# Patient Record
Sex: Female | Born: 1965 | Race: White | Hispanic: No | Smoking: Current every day smoker
Health system: Southern US, Community
[De-identification: ages and names within clinical notes are randomized; demographics above are authoritative.]

## PROBLEM LIST (undated history)

## (undated) DIAGNOSIS — H9209 Otalgia, unspecified ear: Secondary | ICD-10-CM

## (undated) DIAGNOSIS — F32A Depression, unspecified: Secondary | ICD-10-CM

## (undated) DIAGNOSIS — J302 Other seasonal allergic rhinitis: Secondary | ICD-10-CM

## (undated) DIAGNOSIS — F988 Other specified behavioral and emotional disorders with onset usually occurring in childhood and adolescence: Secondary | ICD-10-CM

## (undated) DIAGNOSIS — T7840XA Allergy, unspecified, initial encounter: Secondary | ICD-10-CM

## (undated) DIAGNOSIS — R87619 Unspecified abnormal cytological findings in specimens from cervix uteri: Secondary | ICD-10-CM

## (undated) DIAGNOSIS — K219 Gastro-esophageal reflux disease without esophagitis: Secondary | ICD-10-CM

## (undated) DIAGNOSIS — F329 Major depressive disorder, single episode, unspecified: Secondary | ICD-10-CM

## (undated) DIAGNOSIS — J449 Chronic obstructive pulmonary disease, unspecified: Secondary | ICD-10-CM

## (undated) DIAGNOSIS — R55 Syncope and collapse: Secondary | ICD-10-CM

## (undated) DIAGNOSIS — R51 Headache: Secondary | ICD-10-CM

## (undated) DIAGNOSIS — B002 Herpesviral gingivostomatitis and pharyngotonsillitis: Secondary | ICD-10-CM

## (undated) DIAGNOSIS — Z9289 Personal history of other medical treatment: Secondary | ICD-10-CM

## (undated) HISTORY — DX: Gastro-esophageal reflux disease without esophagitis: K21.9

## (undated) HISTORY — DX: Personal history of other medical treatment: Z92.89

## (undated) HISTORY — DX: Herpesviral gingivostomatitis and pharyngotonsillitis: B00.2

## (undated) HISTORY — DX: Syncope and collapse: R55

## (undated) HISTORY — PX: BREAST BIOPSY: SHX20

## (undated) HISTORY — DX: Allergy, unspecified, initial encounter: T78.40XA

## (undated) HISTORY — DX: Chronic obstructive pulmonary disease, unspecified: J44.9

## (undated) HISTORY — DX: Depression, unspecified: F32.A

## (undated) HISTORY — DX: Otalgia, unspecified ear: H92.09

## (undated) HISTORY — DX: Headache: R51

## (undated) HISTORY — DX: Major depressive disorder, single episode, unspecified: F32.9

## (undated) HISTORY — PX: TONSILLECTOMY AND ADENOIDECTOMY: SUR1326

## (undated) HISTORY — DX: Unspecified abnormal cytological findings in specimens from cervix uteri: R87.619

## (undated) HISTORY — DX: Other specified behavioral and emotional disorders with onset usually occurring in childhood and adolescence: F98.8

## (undated) HISTORY — DX: Other seasonal allergic rhinitis: J30.2

---

## 1993-09-10 DIAGNOSIS — R87619 Unspecified abnormal cytological findings in specimens from cervix uteri: Secondary | ICD-10-CM

## 1993-09-10 HISTORY — DX: Unspecified abnormal cytological findings in specimens from cervix uteri: R87.619

## 2006-02-10 ENCOUNTER — Ambulatory Visit: Payer: Self-pay | Admitting: Internal Medicine

## 2006-04-10 ENCOUNTER — Ambulatory Visit: Payer: Self-pay

## 2007-05-22 ENCOUNTER — Ambulatory Visit: Payer: Self-pay | Admitting: General Surgery

## 2012-10-26 DIAGNOSIS — R519 Headache, unspecified: Secondary | ICD-10-CM

## 2012-10-26 DIAGNOSIS — R55 Syncope and collapse: Secondary | ICD-10-CM

## 2012-10-26 HISTORY — DX: Headache, unspecified: R51.9

## 2012-10-26 HISTORY — DX: Syncope and collapse: R55

## 2013-12-12 DIAGNOSIS — B002 Herpesviral gingivostomatitis and pharyngotonsillitis: Secondary | ICD-10-CM

## 2013-12-12 HISTORY — DX: Herpesviral gingivostomatitis and pharyngotonsillitis: B00.2

## 2016-03-17 ENCOUNTER — Other Ambulatory Visit: Payer: Self-pay | Admitting: Obstetrics and Gynecology

## 2016-03-17 DIAGNOSIS — Z1231 Encounter for screening mammogram for malignant neoplasm of breast: Secondary | ICD-10-CM

## 2016-03-17 LAB — HM PAP SMEAR

## 2016-04-01 ENCOUNTER — Ambulatory Visit
Admission: RE | Admit: 2016-04-01 | Discharge: 2016-04-01 | Disposition: A | Discharge status: Home / Self Care | Payer: Self-pay | Source: Ambulatory Visit | Attending: Obstetrics and Gynecology | Admitting: Obstetrics and Gynecology

## 2016-04-01 ENCOUNTER — Other Ambulatory Visit: Payer: Self-pay | Admitting: Obstetrics and Gynecology

## 2016-04-01 DIAGNOSIS — Z1231 Encounter for screening mammogram for malignant neoplasm of breast: Secondary | ICD-10-CM | POA: Insufficient documentation

## 2016-04-01 LAB — HM MAMMOGRAPHY

## 2016-12-16 ENCOUNTER — Other Ambulatory Visit: Payer: Self-pay | Admitting: Obstetrics and Gynecology

## 2016-12-16 NOTE — Telephone Encounter (Signed)
Pt given 3 months of Rx 2/18.

## 2017-02-14 ENCOUNTER — Other Ambulatory Visit: Payer: Self-pay | Admitting: Obstetrics and Gynecology

## 2017-02-14 ENCOUNTER — Telehealth: Payer: Self-pay

## 2017-02-14 MED ORDER — AMPHETAMINE-DEXTROAMPHETAMINE 10 MG PO TABS: 10.0000 mg | ORAL_TABLET | Freq: Two times a day (BID) | ORAL | 0 refills | Status: DC

## 2017-02-14 NOTE — Telephone Encounter (Signed)
Pt aware and plans to schedule annual exam when she picks up rx.

## 2017-02-14 NOTE — Telephone Encounter (Signed)
RN to notify pt to pick up Rx at front desk. Annual due 6/18

## 2017-02-14 NOTE — Telephone Encounter (Signed)
Patient calling to request a refill on her adderall rx. Please advise. Cb# [phone]. Thank you!

## 2017-03-16 ENCOUNTER — Other Ambulatory Visit: Payer: Self-pay | Admitting: Obstetrics and Gynecology

## 2017-03-16 ENCOUNTER — Telehealth: Payer: Self-pay

## 2017-03-16 DIAGNOSIS — F988 Other specified behavioral and emotional disorders with onset usually occurring in childhood and adolescence: Secondary | ICD-10-CM

## 2017-03-16 MED ORDER — AMPHETAMINE-DEXTROAMPHETAMINE 10 MG PO TABS: 10.0000 mg | ORAL_TABLET | Freq: Two times a day (BID) | ORAL | 0 refills | Status: DC

## 2017-03-16 NOTE — Telephone Encounter (Signed)
Pt aware Rx is ready for pickup 

## 2017-03-16 NOTE — Telephone Encounter (Signed)
Pt called triage line stating she needs a refill on her Adderall. CB# [phone]

## 2017-03-16 NOTE — Telephone Encounter (Signed)
RN to notify pt to pick up at front desk. Thx.

## 2017-04-17 ENCOUNTER — Telehealth: Payer: Self-pay

## 2017-04-17 NOTE — Telephone Encounter (Signed)
Pt requests refill on Adderall.

## 2017-04-18 ENCOUNTER — Other Ambulatory Visit: Payer: Self-pay | Admitting: Obstetrics and Gynecology

## 2017-04-18 MED ORDER — AMPHETAMINE-DEXTROAMPHETAMINE 10 MG PO TABS: 10.0000 mg | ORAL_TABLET | Freq: Two times a day (BID) | ORAL | 0 refills | Status: DC

## 2017-04-18 NOTE — Telephone Encounter (Signed)
Pt aware.

## 2017-04-18 NOTE — Telephone Encounter (Signed)
RN to notify pt ready to pick up at front desk.

## 2017-05-10 ENCOUNTER — Ambulatory Visit: Payer: Self-pay | Admitting: Obstetrics and Gynecology

## 2017-05-17 ENCOUNTER — Other Ambulatory Visit: Payer: Self-pay

## 2017-05-17 NOTE — Telephone Encounter (Signed)
RN to notify pt she is past due for annual. Pls sched and I'll RF adderall till then. Thx.

## 2017-05-17 NOTE — Telephone Encounter (Signed)
Pt calling for refill of adderall.  [phone]

## 2017-05-18 ENCOUNTER — Other Ambulatory Visit: Payer: Self-pay | Admitting: Obstetrics and Gynecology

## 2017-05-18 MED ORDER — AMPHETAMINE-DEXTROAMPHETAMINE 10 MG PO TABS: 10.0000 mg | ORAL_TABLET | Freq: Two times a day (BID) | ORAL | 0 refills | Status: DC

## 2017-05-18 NOTE — Telephone Encounter (Signed)
Patient has rescheduled her annual for second week of October.  Patient apologized for having to rescheduling.  She had to go out of town for an emergency.

## 2017-05-18 NOTE — Telephone Encounter (Signed)
Please advise 

## 2017-05-18 NOTE — Telephone Encounter (Signed)
RN to notify pt Rx at front desk to pick up.

## 2017-05-18 NOTE — Telephone Encounter (Signed)
Pt aware of medication prescription being ready for pick up

## 2017-05-18 NOTE — Progress Notes (Signed)
Rx RF adderall. Pt has annual 10/18.

## 2017-07-06 ENCOUNTER — Encounter: Payer: Self-pay | Admitting: Obstetrics and Gynecology

## 2017-07-06 ENCOUNTER — Ambulatory Visit (INDEPENDENT_AMBULATORY_CARE_PROVIDER_SITE_OTHER): Payer: Self-pay | Admitting: Obstetrics and Gynecology

## 2017-07-06 VITALS — BP 118/76 | HR 80 | Ht 60.0 in | Wt 116.0 lb

## 2017-07-06 DIAGNOSIS — F419 Anxiety disorder, unspecified: Secondary | ICD-10-CM

## 2017-07-06 DIAGNOSIS — Z1211 Encounter for screening for malignant neoplasm of colon: Secondary | ICD-10-CM

## 2017-07-06 DIAGNOSIS — Z01419 Encounter for gynecological examination (general) (routine) without abnormal findings: Secondary | ICD-10-CM

## 2017-07-06 DIAGNOSIS — Z1231 Encounter for screening mammogram for malignant neoplasm of breast: Secondary | ICD-10-CM

## 2017-07-06 DIAGNOSIS — Z1239 Encounter for other screening for malignant neoplasm of breast: Secondary | ICD-10-CM

## 2017-07-06 DIAGNOSIS — F988 Other specified behavioral and emotional disorders with onset usually occurring in childhood and adolescence: Secondary | ICD-10-CM

## 2017-07-06 DIAGNOSIS — B001 Herpesviral vesicular dermatitis: Secondary | ICD-10-CM

## 2017-07-06 LAB — HEMOCCULT GUIAC POC 1CARD (OFFICE): FECAL OCCULT BLD: NEGATIVE

## 2017-07-06 MED ORDER — FLUOXETINE HCL 10 MG PO TABS: 10.0000 mg | ORAL_TABLET | Freq: Every day | ORAL | 5 refills | Status: DC

## 2017-07-06 MED ORDER — VALACYCLOVIR HCL 1 G PO TABS: ORAL_TABLET | ORAL | 1 refills | Status: DC

## 2017-07-06 MED ORDER — AMPHETAMINE-DEXTROAMPHETAMINE 10 MG PO TABS: 10.0000 mg | ORAL_TABLET | Freq: Two times a day (BID) | ORAL | 0 refills | Status: DC

## 2017-07-06 NOTE — Progress Notes (Signed)
PCP: Patient, No Pcp Per   Chief Complaint  Patient presents with  . Gynecologic Exam    HPI:      [name] is a 51 y.o. [id number] who LMP was No LMP recorded., presents today for her annual examination.  Her menses are absent since 12/17. She does not have intermenstrual bleeding.  She does have vasomotor sx, night sweats worse than hot flashes.   Sex activity: single partner, contraception - post menopausal status. She does have vaginal dryness, uses lubricants.  Last Pap: March 17, 2016  Results were: no abnormalities /neg HPV DNA.  Hx of STDs: none  Last mammogram: April 01, 2016  Results were: normal--routine follow-up in 12 months There is no FH of breast cancer. There is no FH of ovarian cancer. The patient does do self-breast exams.  Colonoscopy: not recent; will do it 1/19 once gets insurance  Tobacco use: The patient currently smokes 1/4 packs of cigarettes per day for the past many years. She is planning to quit.  Alcohol use: none Exercise: moderately active  She does get adequate calcium and Vitamin D in her diet.  She takes prozac occas for anxiety sx with sx relief. She needs Rx RF.  She also needs RF on valtrex for cold sores.  She takes adderall BID for concentration with improvement most days.  Past Medical History:  Diagnosis Date  . Abnormal Pap smear of cervix 09/10/1993   LGSIL  . ADD (attention deficit disorder)   . Depression   . Ear pain   . GERD (gastroesophageal reflux disease)   . Head ache 10/26/2012  . History of mammogram 02/14/2015; 04/01/2016   BIRAD 1; NEG  . History of Papanicolaou smear of cervix 10/26/2012; 03/17/2016   -/-; -/-  . 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 Seasonal allergies   . Syncope and collapse 10/26/2012    Past Surgical History:  Procedure Laterality Date  . BREAST BIOPSY Left    CORE - NEG  . TONSILLECTOMY AND ADENOIDECTOMY      Family History  Problem Relation Age of Onset  . Diabetes Mother        TYPE 2    . Breast cancer Neg Hx     Social History   Social History  . Marital status: Divorced    Spouse name: N/A  . Number of children: 2  . Years of education: N/A   Occupational History  . Not on file.   Social History Main Topics  . Smoking status: Current Some Day Smoker    Packs/day: 0.25    Types: Cigarettes  . Smokeless tobacco: Never Used  . Alcohol use Yes  . Drug use: No  . Sexual activity: Yes    Birth control/ protection: None, Post-menopausal   Other Topics Concern  . Not on file   Social History Narrative  . No narrative on file    Current Meds  Medication Sig  . amphetamine-dextroamphetamine (ADDERALL) 10 MG tablet Take 1 tablet (10 mg total) by mouth 2 (two) times daily with a meal. Can fill after 09/17/17  . cetirizine (ZYRTEC) 10 MG tablet Take 10 mg by mouth daily.  Marland Kitchen FLUoxetine (PROZAC) 10 MG tablet Take 1 tablet (10 mg total) by mouth daily.  . [DISCONTINUED] amphetamine-dextroamphetamine (ADDERALL) 10 MG tablet Take 1 tablet (10 mg total) by mouth 2 (two) times daily with a meal. Can fill after 06/18/17  . [DISCONTINUED] amphetamine-dextroamphetamine (ADDERALL) 10 MG tablet Take 1 tablet (10 mg  total) by mouth 2 (two) times daily with a meal. Can fill after 07/18/17  . [DISCONTINUED] amphetamine-dextroamphetamine (ADDERALL) 10 MG tablet Take 1 tablet (10 mg total) by mouth 2 (two) times daily with a meal. Can fill after 08/18/17  . [DISCONTINUED] FLUoxetine (PROZAC) 10 MG tablet Take 1 tablet by mouth daily.      ROS:  Review of Systems  Constitutional: Negative for fatigue, fever and unexpected weight change.  Respiratory: Negative for cough, shortness of breath and wheezing.   Cardiovascular: Negative for chest pain, palpitations and leg swelling.  Gastrointestinal: Negative for blood in stool, constipation, diarrhea, nausea and vomiting.  Endocrine: Negative for cold intolerance, heat intolerance and polyuria.  Genitourinary: Negative for  dyspareunia, dysuria, flank pain, frequency, genital sores, hematuria, menstrual problem, pelvic pain, urgency, vaginal bleeding, vaginal discharge and vaginal pain.  Musculoskeletal: Negative for back pain, joint swelling and myalgias.  Skin: Negative for rash.  Neurological: Negative for dizziness, syncope, light-headedness, numbness and headaches.  Hematological: Negative for adenopathy.  Psychiatric/Behavioral: Negative for agitation, confusion, sleep disturbance and suicidal ideas. The patient is not nervous/anxious.      Objective: BP 118/76   Pulse 80   Ht 5' (1.524 m)   Wt 116 lb (52.6 kg)   BMI 22.65 kg/m    Physical Exam  Constitutional: She is oriented to person, place, and time. She appears well-developed and well-nourished.  Genitourinary: Rectum normal, vagina normal and uterus normal. There is no rash or tenderness on the right labia. There is no rash or tenderness on the left labia. No erythema or tenderness in the vagina. No vaginal discharge found. Right adnexum does not display mass and does not display tenderness. Left adnexum does not display mass and does not display tenderness. Cervix does not exhibit motion tenderness or polyp. Uterus is not enlarged or tender. Rectal exam shows no fissure, no tenderness and guaiac negative stool.  Neck: Normal range of motion. No thyromegaly present.  Cardiovascular: Normal rate, regular rhythm and normal heart sounds.   No murmur heard. Pulmonary/Chest: Effort normal and breath sounds normal. Right breast exhibits no mass, no nipple discharge, no skin change and no tenderness. Left breast exhibits no mass, no nipple discharge, no skin change and no tenderness.  Abdominal: Soft. There is no tenderness. There is no guarding.  Musculoskeletal: Normal range of motion.  Neurological: She is alert and oriented to person, place, and time. No cranial nerve deficit.  Psychiatric: She has a normal mood and affect. Her behavior is normal.    Vitals reviewed.   Results: Results for orders placed or performed in visit on 07/06/17 (from the past 24 hour(s))  POCT Occult Blood Stool     Status: Normal   Collection Time: 07/06/17  9:47 AM  Result Value Ref Range   Fecal Occult Blood, POC Negative Negative   Card #1 Date     Card #2 Fecal Occult Blod, POC     Card #2 Date     Card #3 Fecal Occult Blood, POC     Card #3 Date      Assessment/Plan:  Encounter for annual routine gynecological examination  Screening for breast cancer - Pt to sched mammo. May wait till ins 1/19. - Plan: MM DIGITAL SCREENING BILATERAL  Attention deficit disorder (ADD) without hyperactivity - Rx RF adderall for 3 months given to pt. - Plan: amphetamine-dextroamphetamine (ADDERALL) 10 MG tablet, DISCONTINUED: amphetamine-dextroamphetamine (ADDERALL) 10 MG tablet  Screening for colon cancer - Neg FOBT. Pt will call  for GI ref for scr colonoscopy 1/19 once has ins. - Plan: POCT Occult Blood Stool  Cold sore - Rx RF valtrex. - Plan: valACYclovir (VALTREX) 1000 MG tablet  Anxiety - Rx RF prozac. Pt takes episodically wtih sx relief.  - Plan: FLUoxetine (PROZAC) 10 MG tablet   Meds ordered this encounter  Medications  . cetirizine (ZYRTEC) 10 MG tablet    Sig: Take 10 mg by mouth daily.  Marland Kitchen DISCONTD: amphetamine-dextroamphetamine (ADDERALL) 10 MG tablet    Sig: Take 1 tablet (10 mg total) by mouth 2 (two) times daily with a meal. Can fill after 07/18/17    Dispense:  60 tablet    Refill:  0  . DISCONTD: amphetamine-dextroamphetamine (ADDERALL) 10 MG tablet    Sig: Take 1 tablet (10 mg total) by mouth 2 (two) times daily with a meal. Can fill after 08/18/17    Dispense:  60 tablet    Refill:  0  . amphetamine-dextroamphetamine (ADDERALL) 10 MG tablet    Sig: Take 1 tablet (10 mg total) by mouth 2 (two) times daily with a meal. Can fill after 09/17/17    Dispense:  60 tablet    Refill:  0  . FLUoxetine (PROZAC) 10 MG tablet    Sig: Take 1  tablet (10 mg total) by mouth daily.    Dispense:  30 tablet    Refill:  5  . valACYclovir (VALTREX) 1000 MG tablet    Sig: Take 2 tabs BID x 1 day prn sx    Dispense:  30 tablet    Refill:  1           GYN counsel mammography screening, menopause, adequate intake of calcium and vitamin D, diet and exercise    F/U  Return in about 1 year (around 07/06/2018).  Cashius Grandstaff B. Franciscojavier Wronski, PA-C 07/06/2017 9:48 AM

## 2017-10-17 ENCOUNTER — Other Ambulatory Visit: Payer: Self-pay | Admitting: Obstetrics and Gynecology

## 2017-10-17 DIAGNOSIS — F988 Other specified behavioral and emotional disorders with onset usually occurring in childhood and adolescence: Secondary | ICD-10-CM

## 2017-10-18 ENCOUNTER — Other Ambulatory Visit: Payer: Self-pay | Admitting: Obstetrics and Gynecology

## 2017-10-18 DIAGNOSIS — F988 Other specified behavioral and emotional disorders with onset usually occurring in childhood and adolescence: Secondary | ICD-10-CM

## 2017-10-18 MED ORDER — AMPHETAMINE-DEXTROAMPHETAMINE 10 MG PO TABS: 10.0000 mg | ORAL_TABLET | Freq: Two times a day (BID) | ORAL | 0 refills | Status: DC

## 2017-10-18 NOTE — Progress Notes (Unsigned)
3 mo Rx RF adderal to pick up.

## 2017-10-18 NOTE — Progress Notes (Signed)
Pt aware.

## 2017-10-18 NOTE — Telephone Encounter (Signed)
Please advise 

## 2018-01-16 ENCOUNTER — Other Ambulatory Visit: Payer: Self-pay | Admitting: Obstetrics and Gynecology

## 2018-01-16 DIAGNOSIS — F988 Other specified behavioral and emotional disorders with onset usually occurring in childhood and adolescence: Secondary | ICD-10-CM

## 2018-01-16 MED ORDER — AMPHETAMINE-DEXTROAMPHETAMINE 10 MG PO TABS: 10.0000 mg | ORAL_TABLET | Freq: Two times a day (BID) | ORAL | 0 refills | Status: DC

## 2018-01-16 NOTE — Progress Notes (Signed)
Rx RF adderall for 3 months left to pick up at front desk.

## 2018-01-16 NOTE — Telephone Encounter (Signed)
Please advise. Thank you

## 2018-04-16 ENCOUNTER — Other Ambulatory Visit: Payer: Self-pay | Admitting: Obstetrics and Gynecology

## 2018-04-16 DIAGNOSIS — F988 Other specified behavioral and emotional disorders with onset usually occurring in childhood and adolescence: Secondary | ICD-10-CM

## 2018-04-17 ENCOUNTER — Other Ambulatory Visit: Payer: Self-pay | Admitting: Obstetrics and Gynecology

## 2018-04-17 DIAGNOSIS — F988 Other specified behavioral and emotional disorders with onset usually occurring in childhood and adolescence: Secondary | ICD-10-CM

## 2018-04-17 MED ORDER — AMPHETAMINE-DEXTROAMPHETAMINE 10 MG PO TABS: 10.0000 mg | ORAL_TABLET | Freq: Two times a day (BID) | ORAL | 0 refills | Status: DC

## 2018-04-17 NOTE — Telephone Encounter (Signed)
Last appointment for annual was 07/06/17. No future appointments scheduled.

## 2018-04-17 NOTE — Progress Notes (Signed)
Rx RF till annual 10/19

## 2018-07-17 ENCOUNTER — Other Ambulatory Visit: Payer: Self-pay | Admitting: Obstetrics and Gynecology

## 2018-07-17 DIAGNOSIS — F988 Other specified behavioral and emotional disorders with onset usually occurring in childhood and adolescence: Secondary | ICD-10-CM

## 2018-07-17 NOTE — Telephone Encounter (Signed)
Please advise 

## 2018-07-18 ENCOUNTER — Other Ambulatory Visit: Payer: Self-pay | Admitting: Obstetrics and Gynecology

## 2018-07-18 DIAGNOSIS — F988 Other specified behavioral and emotional disorders with onset usually occurring in childhood and adolescence: Secondary | ICD-10-CM

## 2018-07-18 MED ORDER — AMPHETAMINE-DEXTROAMPHETAMINE 10 MG PO TABS: 10.0000 mg | ORAL_TABLET | Freq: Two times a day (BID) | ORAL | 0 refills | Status: DC

## 2018-07-18 NOTE — Telephone Encounter (Signed)
Pls let me know when scheduled so I can refill meds. Thx.

## 2018-07-18 NOTE — Telephone Encounter (Signed)
Patient is schedule 08/07/18 with ABC

## 2018-07-18 NOTE — Telephone Encounter (Signed)
Christina Reyes, patient needs an annual exam.

## 2018-07-18 NOTE — Telephone Encounter (Signed)
Please advise 

## 2018-07-18 NOTE — Telephone Encounter (Signed)
Cont.. 

## 2018-08-07 ENCOUNTER — Ambulatory Visit: Payer: Self-pay | Admitting: Obstetrics and Gynecology

## 2018-08-16 ENCOUNTER — Other Ambulatory Visit: Payer: Self-pay | Admitting: Obstetrics and Gynecology

## 2018-08-16 ENCOUNTER — Encounter: Payer: Self-pay | Admitting: Obstetrics and Gynecology

## 2018-08-16 DIAGNOSIS — F988 Other specified behavioral and emotional disorders with onset usually occurring in childhood and adolescence: Secondary | ICD-10-CM

## 2018-08-16 MED ORDER — AMPHETAMINE-DEXTROAMPHETAMINE 10 MG PO TABS: 10.0000 mg | ORAL_TABLET | Freq: Two times a day (BID) | ORAL | 0 refills | Status: DC

## 2018-08-16 NOTE — Progress Notes (Signed)
Rx RF till 12/19 annual 

## 2018-08-16 NOTE — Telephone Encounter (Signed)
Please advise 

## 2018-09-02 NOTE — Progress Notes (Deleted)
PCP: Patient, No Pcp Per   No chief complaint on file.   HPI:      [name] is a 52 y.o. [id number] who LMP was No LMP recorded., presents today for her annual examination.  Her menses are absent since 12/17. She does not have intermenstrual bleeding.  She does have vasomotor sx, night sweats worse than hot flashes.   Sex activity: single partner, contraception - post menopausal status. She does have vaginal dryness, uses lubricants.  Last Pap: March 17, 2016  Results were: no abnormalities /neg HPV DNA.  Hx of STDs: none  Last mammogram: April 01, 2016  Results were: normal--routine follow-up in 12 months There is no FH of breast cancer. There is no FH of ovarian cancer. The patient does do self-breast exams.  Colonoscopy: not recent; will do it 1/19 once gets insurance  Tobacco use: The patient currently smokes 1/4 packs of cigarettes per day for the past many years. She is planning to quit.  Alcohol use: none Exercise: moderately active  She does get adequate calcium and Vitamin D in her diet.  She takes prozac occas for anxiety sx with sx relief. She needs Rx RF.  She also needs RF on valtrex for cold sores.  She takes adderall BID for concentration with improvement most days.  Past Medical History:  Diagnosis Date  . Abnormal Pap smear of cervix 09/10/1993   LGSIL  . ADD (attention deficit disorder)   . Depression   . Ear pain   . GERD (gastroesophageal reflux disease)   . Head ache 10/26/2012  . History of mammogram 02/14/2015; 04/01/2016   BIRAD 1; NEG  . History of Papanicolaou smear of cervix 10/26/2012; 03/17/2016   -/-; -/-  . 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 Seasonal allergies   . Syncope and collapse 10/26/2012    Past Surgical History:  Procedure Laterality Date  . BREAST BIOPSY Left    CORE - NEG  . TONSILLECTOMY AND ADENOIDECTOMY      Family History  Problem Relation Age of Onset  . Diabetes Mother        TYPE 2  . Breast cancer Neg Hx      Social History   Socioeconomic History  . Marital status: Divorced    Spouse name: Not on file  . Number of children: 2  . Years of education: Not on file  . Highest education level: Not on file  Occupational History  . Not on file  Social Needs  . Financial resource strain: Not on file  . Food insecurity:    Worry: Not on file    Inability: Not on file  . Transportation needs:    Medical: Not on file    Non-medical: Not on file  Tobacco Use  . Smoking status: Current Some Day Smoker    Packs/day: 0.25    Types: Cigarettes  . Smokeless tobacco: Never Used  Substance and Sexual Activity  . Alcohol use: Yes  . Drug use: No  . Sexual activity: Yes    Birth control/protection: None, Post-menopausal  Lifestyle  . Physical activity:    Days per week: Not on file    Minutes per session: Not on file  . Stress: Not on file  Relationships  . Social connections:    Talks on phone: Not on file    Gets together: Not on file    Attends religious service: Not on file    Active member of club or organization: Not  on file    Attends meetings of clubs or organizations: Not on file    Relationship status: Not on file  . Intimate partner violence:    Fear of current or ex partner: Not on file    Emotionally abused: Not on file    Physically abused: Not on file    Forced sexual activity: Not on file  Other Topics Concern  . Not on file  Social History Narrative  . Not on file    No outpatient medications have been marked as taking for the 09/03/18 encounter (Appointment) with , Ilona Sorrel, PA-C.      ROS:  Review of Systems  Constitutional: Negative for fatigue, fever and unexpected weight change.  Respiratory: Negative for cough, shortness of breath and wheezing.   Cardiovascular: Negative for chest pain, palpitations and leg swelling.  Gastrointestinal: Negative for blood in stool, constipation, diarrhea, nausea and vomiting.  Endocrine: Negative for cold  intolerance, heat intolerance and polyuria.  Genitourinary: Negative for dyspareunia, dysuria, flank pain, frequency, genital sores, hematuria, menstrual problem, pelvic pain, urgency, vaginal bleeding, vaginal discharge and vaginal pain.  Musculoskeletal: Negative for back pain, joint swelling and myalgias.  Skin: Negative for rash.  Neurological: Negative for dizziness, syncope, light-headedness, numbness and headaches.  Hematological: Negative for adenopathy.  Psychiatric/Behavioral: Negative for agitation, confusion, sleep disturbance and suicidal ideas. The patient is not nervous/anxious.      Objective: There were no vitals taken for this visit.   Physical Exam  Constitutional: She is oriented to person, place, and time. She appears well-developed and well-nourished.  Genitourinary: Rectum normal, vagina normal and uterus normal. There is no rash or tenderness on the right labia. There is no rash or tenderness on the left labia. No erythema or tenderness in the vagina. No vaginal discharge found. Right adnexum does not display mass and does not display tenderness. Left adnexum does not display mass and does not display tenderness. Cervix does not exhibit motion tenderness or polyp. Uterus is not enlarged or tender. Rectal exam shows no fissure, no tenderness and guaiac negative stool.  Neck: Normal range of motion. No thyromegaly present.  Cardiovascular: Normal rate, regular rhythm and normal heart sounds.  No murmur heard. Pulmonary/Chest: Effort normal and breath sounds normal. Right breast exhibits no mass, no nipple discharge, no skin change and no tenderness. Left breast exhibits no mass, no nipple discharge, no skin change and no tenderness.  Abdominal: Soft. There is no tenderness. There is no guarding.  Musculoskeletal: Normal range of motion.  Neurological: She is alert and oriented to person, place, and time. No cranial nerve deficit.  Psychiatric: She has a normal mood and  affect. Her behavior is normal.  Vitals reviewed.   Results: No results found for this or any previous visit (from the past 24 hour(s)).  Assessment/Plan:  No diagnosis found.   No orders of the defined types were placed in this encounter.          GYN counsel mammography screening, menopause, adequate intake of calcium and vitamin D, diet and exercise    F/U  No follow-ups on file.   B. , PA-C 09/02/2018 8:23 PM

## 2018-09-03 ENCOUNTER — Ambulatory Visit: Payer: Self-pay | Admitting: Obstetrics and Gynecology

## 2018-09-04 ENCOUNTER — Ambulatory Visit (INDEPENDENT_AMBULATORY_CARE_PROVIDER_SITE_OTHER): Payer: Self-pay | Admitting: Obstetrics and Gynecology

## 2018-09-04 ENCOUNTER — Encounter: Payer: Self-pay | Admitting: Obstetrics and Gynecology

## 2018-09-04 VITALS — BP 120/70 | HR 87 | Ht 61.0 in | Wt 119.0 lb

## 2018-09-04 DIAGNOSIS — Z01419 Encounter for gynecological examination (general) (routine) without abnormal findings: Secondary | ICD-10-CM

## 2018-09-04 DIAGNOSIS — B001 Herpesviral vesicular dermatitis: Secondary | ICD-10-CM

## 2018-09-04 DIAGNOSIS — Z1239 Encounter for other screening for malignant neoplasm of breast: Secondary | ICD-10-CM

## 2018-09-04 DIAGNOSIS — F988 Other specified behavioral and emotional disorders with onset usually occurring in childhood and adolescence: Secondary | ICD-10-CM

## 2018-09-04 DIAGNOSIS — F419 Anxiety disorder, unspecified: Secondary | ICD-10-CM

## 2018-09-04 DIAGNOSIS — Z1211 Encounter for screening for malignant neoplasm of colon: Secondary | ICD-10-CM

## 2018-09-04 LAB — HEMOCCULT GUIAC POC 1CARD (OFFICE): Fecal Occult Blood, POC: NEGATIVE

## 2018-09-04 MED ORDER — VALACYCLOVIR HCL 1 G PO TABS: ORAL_TABLET | ORAL | 1 refills | Status: DC

## 2018-09-04 MED ORDER — AMPHETAMINE-DEXTROAMPHETAMINE 10 MG PO TABS: 10.0000 mg | ORAL_TABLET | Freq: Two times a day (BID) | ORAL | 0 refills | Status: DC

## 2018-09-04 NOTE — Patient Instructions (Signed)
I value your feedback and entrusting us with your care. If you get a [facility] [place] patient survey, I would appreciate you taking the time to let us know about your experience today. Thank you! 

## 2018-09-04 NOTE — Progress Notes (Signed)
PCP: Patient, No Pcp Per   Chief Complaint  Patient presents with  . Gynecologic Exam    HPI:      [name] is a 52 y.o. [id number] who LMP was No LMP recorded. (Menstrual status: Other)., presents today for her annual examination.  Her menses are absent since 12/17. She does not have intermenstrual bleeding.  She does have vasomotor sx, night sweats worse than hot flashes.   Sex activity: single partner, contraception - post menopausal status. She does have vaginal dryness, uses lubricants.  Last Pap: March 17, 2016  Results were: no abnormalities /neg HPV DNA.  Hx of STDs: none  Last mammogram: April 01, 2016  Results were: normal--routine follow-up in 12 months There is no FH of breast cancer. There is no FH of ovarian cancer. The patient does do self-breast exams.  Colonoscopy: not recent; wants to do when has insurance. Has hx of hemorrhoids, occas BRBPR with hemorrhoids. Has constipation/diarrhea episodes.  Tobacco use: The patient currently smokes 1/4 packs of cigarettes per day for the past many years. She is planning to quit.  Alcohol use: none Exercise: moderately active  She does get adequate calcium but Vitamin D in her diet.  She takes prozac occas for anxiety sx with sx relief. She doesn't need Rx RF today.  She needs RF on valtrex for cold sores.  She takes adderall BID for concentration with improvement most days.  Past Medical History:  Diagnosis Date  . Abnormal Pap smear of cervix 09/10/1993   LGSIL  . ADD (attention deficit disorder)   . Depression   . Ear pain   . GERD (gastroesophageal reflux disease)   . Head ache 10/26/2012  . History of mammogram 02/14/2015; 04/01/2016   BIRAD 1; NEG  . History of Papanicolaou smear of cervix 10/26/2012; 03/17/2016   -/-; -/-  . 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 Seasonal allergies   . Syncope and collapse 10/26/2012    Past Surgical History:  Procedure Laterality Date  . BREAST BIOPSY Left    CORE - NEG  .  TONSILLECTOMY AND ADENOIDECTOMY      Family History  Problem Relation Age of Onset  . Diabetes Mother        TYPE 2  . Breast cancer Neg Hx     Social History   Socioeconomic History  . Marital status: Divorced    Spouse name: Not on file  . Number of children: 2  . Years of education: Not on file  . Highest education level: Not on file  Occupational History  . Not on file  Social Needs  . Financial resource strain: Not on file  . Food insecurity:    Worry: Not on file    Inability: Not on file  . Transportation needs:    Medical: Not on file    Non-medical: Not on file  Tobacco Use  . Smoking status: Current Some Day Smoker    Packs/day: 0.25    Types: Cigarettes  . Smokeless tobacco: Never Used  Substance and Sexual Activity  . Alcohol use: Yes    Comment: very rare  . Drug use: No  . Sexual activity: Yes    Birth control/protection: None, Post-menopausal  Lifestyle  . Physical activity:    Days per week: Not on file    Minutes per session: Not on file  . Stress: Not on file  Relationships  . Social connections:    Talks on phone: Not on file  Gets together: Not on file    Attends religious service: Not on file    Active member of club or organization: Not on file    Attends meetings of clubs or organizations: Not on file    Relationship status: Not on file  . Intimate partner violence:    Fear of current or ex partner: Not on file    Emotionally abused: Not on file    Physically abused: Not on file    Forced sexual activity: Not on file  Other Topics Concern  . Not on file  Social History Narrative  . Not on file    Current Meds  Medication Sig  . amphetamine-dextroamphetamine (ADDERALL) 10 MG tablet Take 1 tablet (10 mg total) by mouth 2 (two) times daily with a meal. Can fill after 11/18/18  . cetirizine (ZYRTEC) 10 MG tablet Take 10 mg by mouth daily.  Marland Kitchen. FLUoxetine (PROZAC) 10 MG tablet Take 1 tablet (10 mg total) by mouth daily.  Marland Kitchen. ibuprofen  (ADVIL,MOTRIN) 200 MG tablet Take by mouth.  . valACYclovir (VALTREX) 1000 MG tablet Take 2 tabs BID x 1 day prn sx  . [DISCONTINUED] amphetamine-dextroamphetamine (ADDERALL) 10 MG tablet Take 1 tablet (10 mg total) by mouth 2 (two) times daily with a meal. Can fill after 08/18/18  . [DISCONTINUED] amphetamine-dextroamphetamine (ADDERALL) 10 MG tablet Take 1 tablet (10 mg total) by mouth 2 (two) times daily with a meal. Can fill after 09/17/18  . [DISCONTINUED] amphetamine-dextroamphetamine (ADDERALL) 10 MG tablet Take 1 tablet (10 mg total) by mouth 2 (two) times daily with a meal. Can fill after 10/18/18  . [DISCONTINUED] valACYclovir (VALTREX) 1000 MG tablet Take 2 tabs BID x 1 day prn sx      ROS:  Review of Systems  Constitutional: Negative for fatigue, fever and unexpected weight change.  Respiratory: Negative for cough, shortness of breath and wheezing.   Cardiovascular: Negative for chest pain, palpitations and leg swelling.  Gastrointestinal: Negative for blood in stool, constipation, diarrhea, nausea and vomiting.  Endocrine: Negative for cold intolerance, heat intolerance and polyuria.  Genitourinary: Negative for dyspareunia, dysuria, flank pain, frequency, genital sores, hematuria, menstrual problem, pelvic pain, urgency, vaginal bleeding, vaginal discharge and vaginal pain.  Musculoskeletal: Negative for back pain, joint swelling and myalgias.  Skin: Negative for rash.  Neurological: Negative for dizziness, syncope, light-headedness, numbness and headaches.  Hematological: Negative for adenopathy.  Psychiatric/Behavioral: Positive for agitation and dysphoric mood. Negative for confusion, sleep disturbance and suicidal ideas. The patient is not nervous/anxious.      Objective: BP 120/70   Pulse 87   Ht 5\' 1"  (1.549 m)   Wt 119 lb (54 kg)   BMI 22.48 kg/m    Physical Exam  Constitutional: She is oriented to person, place, and time. She appears well-developed and  well-nourished.  Genitourinary: Vagina normal and uterus normal. There is no rash or tenderness on the right labia. There is no rash or tenderness on the left labia. No erythema or tenderness in the vagina. No vaginal discharge found. Right adnexum does not display mass and does not display tenderness. Left adnexum does not display mass and does not display tenderness. Cervix does not exhibit motion tenderness or polyp. Uterus is not enlarged or tender. Rectal exam shows external hemorrhoid. Rectal exam shows no fissure, no tenderness and guaiac negative stool.  Neck: Normal range of motion. No thyromegaly present.  Cardiovascular: Normal rate, regular rhythm and normal heart sounds.  No murmur heard. Pulmonary/Chest: Effort normal  and breath sounds normal. Right breast exhibits no mass, no nipple discharge, no skin change and no tenderness. Left breast exhibits no mass, no nipple discharge, no skin change and no tenderness.  Abdominal: Soft. There is no tenderness. There is no guarding.  Musculoskeletal: Normal range of motion.  Neurological: She is alert and oriented to person, place, and time. No cranial nerve deficit.  Psychiatric: She has a normal mood and affect. Her behavior is normal.  Vitals reviewed.   Results: Results for orders placed or performed in visit on 09/04/18 (from the past 24 hour(s))  POCT Occult Blood Stool     Status: Normal   Collection Time: 09/04/18  2:24 PM  Result Value Ref Range   Fecal Occult Blood, POC Negative Negative   Card #1 Date     Card #2 Fecal Occult Blod, POC     Card #2 Date     Card #3 Fecal Occult Blood, POC     Card #3 Date      Assessment/Plan:  Encounter for annual routine gynecological examination  Screening for breast cancer - Pt to sched mammo. Recommended [employer] fund since self-pay - Plan: MM 3D SCREEN BREAST BILATERAL  Screening for colon cancer - Neg FOBT. Pt to call when has ins for scr colonoscopy/ref to GI. - Plan: POCT  Occult Blood Stool  Anxiety - Takes prozac occas. Doesn't need RF today.  Cold sore - Rx RF valtrex prn. - Plan: valACYclovir (VALTREX) 1000 MG tablet  Attention deficit disorder (ADD) without hyperactivity - Rx RF adderall for 3 months  - Plan: amphetamine-dextroamphetamine (ADDERALL) 10 MG tablet, DISCONTINUED: amphetamine-dextroamphetamine (ADDERALL) 10 MG tablet, DISCONTINUED: amphetamine-dextroamphetamine (ADDERALL) 10 MG tablet   Meds ordered this encounter  Medications  . valACYclovir (VALTREX) 1000 MG tablet    Sig: Take 2 tabs BID x 1 day prn sx    Dispense:  30 tablet    Refill:  1    Order Specific Question:   Supervising Provider    Answer:   Nadara Mustard [id number]  . DISCONTD: amphetamine-dextroamphetamine (ADDERALL) 10 MG tablet    Sig: Take 1 tablet (10 mg total) by mouth 2 (two) times daily with a meal. Can fill after 09/17/18    Dispense:  60 tablet    Refill:  0    Order Specific Question:   Supervising Provider    Answer:   Nadara Mustard [id number]  . DISCONTD: amphetamine-dextroamphetamine (ADDERALL) 10 MG tablet    Sig: Take 1 tablet (10 mg total) by mouth 2 (two) times daily with a meal. Can fill after 10/18/18    Dispense:  60 tablet    Refill:  0    Order Specific Question:   Supervising Provider    Answer:   Nadara Mustard [id number]  . amphetamine-dextroamphetamine (ADDERALL) 10 MG tablet    Sig: Take 1 tablet (10 mg total) by mouth 2 (two) times daily with a meal. Can fill after 11/18/18    Dispense:  60 tablet    Refill:  0    Order Specific Question:   Supervising Provider    Answer:   Nadara Mustard [[id number]]           GYN counsel mammography screening, menopause, adequate intake of calcium and vitamin D, diet and exercise    F/U  Return in about 1 year (around 09/05/2019).  Alicia B. Copland, PA-C 09/04/2018 2:27 PM

## 2018-11-16 ENCOUNTER — Other Ambulatory Visit: Payer: Self-pay | Admitting: Obstetrics and Gynecology

## 2018-11-16 DIAGNOSIS — F988 Other specified behavioral and emotional disorders with onset usually occurring in childhood and adolescence: Secondary | ICD-10-CM

## 2018-11-16 NOTE — Telephone Encounter (Signed)
Please advise 

## 2018-12-14 ENCOUNTER — Other Ambulatory Visit: Payer: Self-pay | Admitting: Obstetrics and Gynecology

## 2018-12-14 DIAGNOSIS — F988 Other specified behavioral and emotional disorders with onset usually occurring in childhood and adolescence: Secondary | ICD-10-CM

## 2018-12-14 NOTE — Telephone Encounter (Signed)
Please advise 

## 2018-12-16 MED ORDER — AMPHETAMINE-DEXTROAMPHETAMINE 10 MG PO TABS: 10.0000 mg | ORAL_TABLET | Freq: Two times a day (BID) | ORAL | 0 refills | Status: DC

## 2019-01-15 ENCOUNTER — Other Ambulatory Visit: Payer: Self-pay | Admitting: Obstetrics and Gynecology

## 2019-01-15 DIAGNOSIS — F988 Other specified behavioral and emotional disorders with onset usually occurring in childhood and adolescence: Secondary | ICD-10-CM

## 2019-01-15 MED ORDER — AMPHETAMINE-DEXTROAMPHETAMINE 10 MG PO TABS: 10.0000 mg | ORAL_TABLET | Freq: Two times a day (BID) | ORAL | 0 refills | Status: DC

## 2019-01-15 NOTE — Progress Notes (Signed)
Rx RF adderall for 3 months 

## 2019-01-15 NOTE — Telephone Encounter (Signed)
Please advise 

## 2019-02-15 ENCOUNTER — Other Ambulatory Visit: Payer: Self-pay | Admitting: Obstetrics and Gynecology

## 2019-02-15 DIAGNOSIS — F988 Other specified behavioral and emotional disorders with onset usually occurring in childhood and adolescence: Secondary | ICD-10-CM

## 2019-02-15 NOTE — Telephone Encounter (Signed)
Please advise 

## 2019-03-18 ENCOUNTER — Emergency Department: Payer: Self-pay

## 2019-03-18 ENCOUNTER — Other Ambulatory Visit: Payer: Self-pay

## 2019-03-18 ENCOUNTER — Observation Stay
Admission: EM | Admit: 2019-03-18 | Discharge: 2019-03-19 | Disposition: A | Discharge status: Home / Self Care | Payer: Self-pay | Attending: Surgery | Admitting: Surgery

## 2019-03-18 DIAGNOSIS — K819 Cholecystitis, unspecified: Secondary | ICD-10-CM

## 2019-03-18 DIAGNOSIS — K8010 Calculus of gallbladder with chronic cholecystitis without obstruction: Principal | ICD-10-CM | POA: Insufficient documentation

## 2019-03-18 DIAGNOSIS — R1084 Generalized abdominal pain: Secondary | ICD-10-CM

## 2019-03-18 DIAGNOSIS — R109 Unspecified abdominal pain: Secondary | ICD-10-CM

## 2019-03-18 DIAGNOSIS — Z1159 Encounter for screening for other viral diseases: Secondary | ICD-10-CM | POA: Insufficient documentation

## 2019-03-18 DIAGNOSIS — Z01818 Encounter for other preprocedural examination: Secondary | ICD-10-CM

## 2019-03-18 DIAGNOSIS — Z79899 Other long term (current) drug therapy: Secondary | ICD-10-CM | POA: Insufficient documentation

## 2019-03-18 DIAGNOSIS — F17210 Nicotine dependence, cigarettes, uncomplicated: Secondary | ICD-10-CM | POA: Insufficient documentation

## 2019-03-18 DIAGNOSIS — J302 Other seasonal allergic rhinitis: Secondary | ICD-10-CM | POA: Insufficient documentation

## 2019-03-18 DIAGNOSIS — F419 Anxiety disorder, unspecified: Secondary | ICD-10-CM | POA: Insufficient documentation

## 2019-03-18 DIAGNOSIS — F329 Major depressive disorder, single episode, unspecified: Secondary | ICD-10-CM | POA: Insufficient documentation

## 2019-03-18 HISTORY — DX: Cholecystitis, unspecified: K81.9

## 2019-03-18 LAB — URINALYSIS, COMPLETE (UACMP) WITH MICROSCOPIC
Bacteria, UA: NONE SEEN
Bilirubin Urine: NEGATIVE
Glucose, UA: NEGATIVE mg/dL
Hgb urine dipstick: NEGATIVE
Ketones, ur: NEGATIVE mg/dL
Leukocytes,Ua: NEGATIVE
Nitrite: NEGATIVE
Protein, ur: NEGATIVE mg/dL
Specific Gravity, Urine: 1.008 (ref 1.005–1.030)
pH: 6 (ref 5.0–8.0)

## 2019-03-18 LAB — COMPREHENSIVE METABOLIC PANEL
ALT: 18 U/L (ref 0–44)
AST: 25 U/L (ref 15–41)
Albumin: 4.6 g/dL (ref 3.5–5.0)
Alkaline Phosphatase: 96 U/L (ref 38–126)
Anion gap: 12 (ref 5–15)
BUN: 13 mg/dL (ref 6–20)
CO2: 27 mmol/L (ref 22–32)
Calcium: 9.3 mg/dL (ref 8.9–10.3)
Chloride: 100 mmol/L (ref 98–111)
Creatinine, Ser: 0.9 mg/dL (ref 0.44–1.00)
GFR calc Af Amer: >60 mL/min (ref >60)
GFR calc non Af Amer: >60 mL/min (ref >60)
Glucose, Bld: 99 mg/dL (ref 70–99)
Potassium: 3.8 mmol/L (ref 3.5–5.1)
Sodium: 139 mmol/L (ref 135–145)
Total Bilirubin: 0.6 mg/dL (ref 0.3–1.2)
Total Protein: 7.6 g/dL (ref 6.5–8.1)

## 2019-03-18 LAB — CBC
HCT: 42.5 % (ref 36.0–46.0)
Hemoglobin: 14.4 g/dL (ref 12.0–15.0)
MCH: 31.7 pg (ref 26.0–34.0)
MCHC: 33.9 g/dL (ref 30.0–36.0)
MCV: 93.6 fL (ref 80.0–100.0)
Platelets: 316 10*3/uL (ref 150–400)
RBC: 4.54 MIL/uL (ref 3.87–5.11)
RDW: 12.7 % (ref 11.5–15.5)
WBC: 7.1 10*3/uL (ref 4.0–10.5)
nRBC: 0 % (ref 0.0–0.2)

## 2019-03-18 LAB — SURGICAL PCR SCREEN
MRSA, PCR: NEGATIVE
Staphylococcus aureus: NEGATIVE

## 2019-03-18 LAB — SARS CORONAVIRUS 2 BY RT PCR (HOSPITAL ORDER, PERFORMED IN ~~LOC~~ HOSPITAL LAB): SARS Coronavirus 2: NEGATIVE

## 2019-03-18 LAB — LIPASE, BLOOD: Lipase: 35 U/L (ref 11–51)

## 2019-03-18 MED ORDER — ONDANSETRON 4 MG PO TBDP: 4.0000 mg | ORAL_TABLET | Freq: Four times a day (QID) | ORAL | Status: DC | PRN

## 2019-03-18 MED ORDER — FLUOXETINE HCL 10 MG PO CAPS
10.0000 mg | ORAL_CAPSULE | Freq: Every day | ORAL | Status: DC
  Administered 2019-03-18: 10 mg via ORAL
  Filled 2019-03-18 (×2): qty 1

## 2019-03-18 MED ORDER — IBUPROFEN 400 MG PO TABS
600.0000 mg | ORAL_TABLET | ORAL | Status: AC
  Administered 2019-03-18: 600 mg via ORAL
  Filled 2019-03-18: qty 2

## 2019-03-18 MED ORDER — KETOROLAC TROMETHAMINE 30 MG/ML IJ SOLN: 30.0000 mg | Freq: Four times a day (QID) | INTRAMUSCULAR | Status: DC | PRN

## 2019-03-18 MED ORDER — SODIUM CHLORIDE 0.9 % IV BOLUS
1000.0000 mL | Freq: Once | INTRAVENOUS | Status: AC
  Administered 2019-03-18: 1000 mL via INTRAVENOUS

## 2019-03-18 MED ORDER — ACETAMINOPHEN 500 MG PO TABS
1000.0000 mg | ORAL_TABLET | Freq: Four times a day (QID) | ORAL | Status: DC
  Administered 2019-03-18: 1000 mg via ORAL
  Filled 2019-03-18: qty 2

## 2019-03-18 MED ORDER — PROCHLORPERAZINE MALEATE 10 MG PO TABS
10.0000 mg | ORAL_TABLET | Freq: Four times a day (QID) | ORAL | Status: DC | PRN
  Filled 2019-03-18: qty 1

## 2019-03-18 MED ORDER — MORPHINE SULFATE (PF) 2 MG/ML IV SOLN: 2.0000 mg | INTRAVENOUS | Status: DC | PRN

## 2019-03-18 MED ORDER — IOHEXOL 300 MG/ML  SOLN
75.0000 mL | Freq: Once | INTRAMUSCULAR | Status: AC | PRN
  Administered 2019-03-18: 75 mL via INTRAVENOUS
  Filled 2019-03-18: qty 75

## 2019-03-18 MED ORDER — ONDANSETRON HCL 4 MG/2ML IJ SOLN: 4.0000 mg | Freq: Four times a day (QID) | INTRAMUSCULAR | Status: DC | PRN

## 2019-03-18 MED ORDER — PENTAFLUOROPROP-TETRAFLUOROETH EX AERO
INHALATION_SPRAY | CUTANEOUS | Status: DC | PRN
  Filled 2019-03-18: qty 30

## 2019-03-18 MED ORDER — PROCHLORPERAZINE EDISYLATE 10 MG/2ML IJ SOLN
5.0000 mg | Freq: Four times a day (QID) | INTRAMUSCULAR | Status: DC | PRN
  Filled 2019-03-18: qty 2

## 2019-03-18 MED ORDER — MUPIROCIN 2 % EX OINT
1.0000 "application " | TOPICAL_OINTMENT | Freq: Two times a day (BID) | CUTANEOUS | Status: DC
  Filled 2019-03-18: qty 22

## 2019-03-18 MED ORDER — PANTOPRAZOLE SODIUM 40 MG IV SOLR
40.0000 mg | Freq: Every day | INTRAVENOUS | Status: DC
  Administered 2019-03-18: 40 mg via INTRAVENOUS
  Filled 2019-03-18: qty 40

## 2019-03-18 MED ORDER — AMPHETAMINE-DEXTROAMPHETAMINE 5 MG PO TABS: 10.0000 mg | ORAL_TABLET | Freq: Two times a day (BID) | ORAL | Status: DC

## 2019-03-18 MED ORDER — HYDRALAZINE HCL 20 MG/ML IJ SOLN: 10.0000 mg | INTRAMUSCULAR | Status: DC | PRN

## 2019-03-18 MED ORDER — SODIUM CHLORIDE 0.9 % IV SOLN
INTRAVENOUS | Status: DC
  Administered 2019-03-18 – 2019-03-19 (×2): via INTRAVENOUS

## 2019-03-18 MED ORDER — PIPERACILLIN-TAZOBACTAM 3.375 G IVPB
3.3750 g | Freq: Three times a day (TID) | INTRAVENOUS | Status: DC
  Administered 2019-03-18 – 2019-03-19 (×3): 3.375 g via INTRAVENOUS
  Filled 2019-03-18 (×5): qty 50

## 2019-03-18 MED ORDER — HEPARIN SODIUM (PORCINE) 5000 UNIT/ML IJ SOLN
5000.0000 [IU] | Freq: Three times a day (TID) | INTRAMUSCULAR | Status: DC
  Administered 2019-03-18: 5000 [IU] via SUBCUTANEOUS
  Filled 2019-03-18: qty 1

## 2019-03-18 NOTE — Progress Notes (Signed)
Case d/w Dr. Jacqualine Code, Ct and u/s pers. Reviewed c/w cholecystitis. We will admit, test for covid and do a lap chole in am.
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
## 2019-03-18 NOTE — Consult Note (Signed)
Pharmacy Antibiotic Note  Levi NYOMIE EHRLICH is a 53 y.o. female admitted on 03/18/2019 with Intra-abdominal infection.  Pharmacy has been consulted for Zosyn dosing.  Plan: Zosyn 3.375 g Q8H (extended infusion).   Height: 5' (152.4 cm) Weight: 110 lb (49.9 kg) IBW/kg (Calculated) : 45.5  Temp (24hrs), Avg:97.6 F (36.4 C), Min:97.6 F (36.4 C), Max:97.6 F (36.4 C)  Recent Labs  Lab 03/18/19 1358  WBC 7.1  CREATININE 0.90    Estimated Creatinine Clearance: 51.9 mL/min (by C-G formula based on SCr of 0.9 mg/dL).    Allergies  Allergen Reactions  . Sulfa Antibiotics Other (See Comments)    Worsened symptoms     Antimicrobials this admission: 6/22 Zosyn >>  Dose adjustments this admission: N/A  Microbiology results: N/A  Thank you for allowing pharmacy to be a part of this patient's care.  Rowland Lathe 03/18/2019 7:16 PM

## 2019-03-18 NOTE — ED Provider Notes (Signed)
Ongoing ED both care assigned to Dr. Jimmye Norman.  Patient admitted to general surgery for evaluation of possible cholecystitis.   Delman Kitten, MD 03/18/19 [phone]

## 2019-03-19 ENCOUNTER — Encounter
Admission: EM | Disposition: A | Discharge status: Home / Self Care | Payer: Self-pay | Source: Home / Self Care | Attending: Emergency Medicine

## 2019-03-19 ENCOUNTER — Observation Stay: Payer: Self-pay | Admitting: Anesthesiology

## 2019-03-19 ENCOUNTER — Observation Stay: Payer: Self-pay

## 2019-03-19 DIAGNOSIS — K819 Cholecystitis, unspecified: Secondary | ICD-10-CM

## 2019-03-19 HISTORY — PX: CHOLECYSTECTOMY: SHX55

## 2019-03-19 SURGERY — LAPAROSCOPIC CHOLECYSTECTOMY
Anesthesia: General

## 2019-03-19 MED ORDER — OXYCODONE-ACETAMINOPHEN 5-325 MG PO TABS
1.0000 | ORAL_TABLET | ORAL | Status: DC | PRN
  Administered 2019-03-19: 1 via ORAL

## 2019-03-19 MED ORDER — BUPIVACAINE-EPINEPHRINE 0.25% -1:200000 IJ SOLN
INTRAMUSCULAR | Status: DC | PRN
  Administered 2019-03-19: 30 mL

## 2019-03-19 MED ORDER — FENTANYL CITRATE (PF) 100 MCG/2ML IJ SOLN
INTRAMUSCULAR | Status: AC
  Administered 2019-03-19: 25 ug via INTRAVENOUS
  Filled 2019-03-19: qty 2

## 2019-03-19 MED ORDER — FENTANYL CITRATE (PF) 100 MCG/2ML IJ SOLN
INTRAMUSCULAR | Status: DC | PRN
  Administered 2019-03-19: 50 ug via INTRAVENOUS
  Administered 2019-03-19 (×2): 25 ug via INTRAVENOUS

## 2019-03-19 MED ORDER — IBUPROFEN 800 MG PO TABS: 800.0000 mg | ORAL_TABLET | Freq: Three times a day (TID) | ORAL | 0 refills | Status: DC | PRN

## 2019-03-19 MED ORDER — SUGAMMADEX SODIUM 200 MG/2ML IV SOLN
INTRAVENOUS | Status: DC | PRN
  Administered 2019-03-19: 200 mg via INTRAVENOUS

## 2019-03-19 MED ORDER — FENTANYL CITRATE (PF) 100 MCG/2ML IJ SOLN
25.0000 ug | INTRAMUSCULAR | Status: DC | PRN
  Administered 2019-03-19 (×4): 25 ug via INTRAVENOUS

## 2019-03-19 MED ORDER — MIDAZOLAM HCL 2 MG/2ML IJ SOLN
INTRAMUSCULAR | Status: AC
  Filled 2019-03-19: qty 2

## 2019-03-19 MED ORDER — OXYCODONE-ACETAMINOPHEN 5-325 MG PO TABS
ORAL_TABLET | ORAL | Status: AC
  Administered 2019-03-19: 1 via ORAL
  Filled 2019-03-19: qty 1

## 2019-03-19 MED ORDER — ALBUTEROL SULFATE HFA 108 (90 BASE) MCG/ACT IN AERS
INHALATION_SPRAY | RESPIRATORY_TRACT | Status: DC | PRN
  Administered 2019-03-19: 8 via RESPIRATORY_TRACT

## 2019-03-19 MED ORDER — KETOROLAC TROMETHAMINE 30 MG/ML IJ SOLN
INTRAMUSCULAR | Status: DC | PRN
  Administered 2019-03-19: 30 mg via INTRAVENOUS

## 2019-03-19 MED ORDER — ONDANSETRON HCL 4 MG/2ML IJ SOLN
INTRAMUSCULAR | Status: DC | PRN
  Administered 2019-03-19: 4 mg via INTRAVENOUS

## 2019-03-19 MED ORDER — SODIUM CHLORIDE FLUSH 0.9 % IV SOLN
INTRAVENOUS | Status: AC
  Filled 2019-03-19: qty 10

## 2019-03-19 MED ORDER — ROCURONIUM BROMIDE 100 MG/10ML IV SOLN
INTRAVENOUS | Status: DC | PRN
  Administered 2019-03-19: 30 mg via INTRAVENOUS
  Administered 2019-03-19: 10 mg via INTRAVENOUS

## 2019-03-19 MED ORDER — PROPOFOL 10 MG/ML IV BOLUS
INTRAVENOUS | Status: DC | PRN
  Administered 2019-03-19: 120 mg via INTRAVENOUS

## 2019-03-19 MED ORDER — PIPERACILLIN-TAZOBACTAM 3.375 G IVPB
INTRAVENOUS | Status: AC
  Filled 2019-03-19: qty 50

## 2019-03-19 MED ORDER — ONDANSETRON HCL 4 MG/2ML IJ SOLN: 4.0000 mg | Freq: Once | INTRAMUSCULAR | Status: DC | PRN

## 2019-03-19 MED ORDER — LACTATED RINGERS IV SOLN: INTRAVENOUS | Status: DC

## 2019-03-19 MED ORDER — ACETAMINOPHEN 325 MG PO TABS
650.0000 mg | ORAL_TABLET | Freq: Once | ORAL | Status: AC
  Administered 2019-03-19: 650 mg via ORAL
  Filled 2019-03-19: qty 2

## 2019-03-19 MED ORDER — FENTANYL CITRATE (PF) 100 MCG/2ML IJ SOLN
INTRAMUSCULAR | Status: AC
  Filled 2019-03-19: qty 2

## 2019-03-19 MED ORDER — ALBUTEROL SULFATE HFA 108 (90 BASE) MCG/ACT IN AERS
INHALATION_SPRAY | RESPIRATORY_TRACT | Status: AC
  Filled 2019-03-19: qty 6.7

## 2019-03-19 MED ORDER — PHENYLEPHRINE HCL (PRESSORS) 10 MG/ML IV SOLN
INTRAVENOUS | Status: DC | PRN
  Administered 2019-03-19 (×3): 100 ug via INTRAVENOUS

## 2019-03-19 MED ORDER — LIDOCAINE HCL (PF) 2 % IJ SOLN
INTRAMUSCULAR | Status: AC
  Filled 2019-03-19: qty 10

## 2019-03-19 MED ORDER — LIDOCAINE HCL (CARDIAC) PF 100 MG/5ML IV SOSY
PREFILLED_SYRINGE | INTRAVENOUS | Status: DC | PRN
  Administered 2019-03-19: 50 mg via INTRAVENOUS

## 2019-03-19 MED ORDER — ROCURONIUM BROMIDE 50 MG/5ML IV SOLN
INTRAVENOUS | Status: AC
  Filled 2019-03-19: qty 1

## 2019-03-19 MED ORDER — DEXAMETHASONE SODIUM PHOSPHATE 10 MG/ML IJ SOLN
INTRAMUSCULAR | Status: DC | PRN
  Administered 2019-03-19: 5 mg via INTRAVENOUS

## 2019-03-19 MED ORDER — MIDAZOLAM HCL 2 MG/2ML IJ SOLN
INTRAMUSCULAR | Status: DC | PRN
  Administered 2019-03-19: 2 mg via INTRAVENOUS

## 2019-03-19 MED ORDER — PROPOFOL 10 MG/ML IV BOLUS
INTRAVENOUS | Status: AC
  Filled 2019-03-19: qty 20

## 2019-03-19 SURGICAL SUPPLY — 41 items
APPLICATOR COTTON TIP 6 STRL (MISCELLANEOUS) ×1 IMPLANT
APPLICATOR COTTON TIP 6IN STRL (MISCELLANEOUS) ×2
APPLIER CLIP 5 13 M/L LIGAMAX5 (MISCELLANEOUS) ×2
CANISTER SUCT 1200ML W/VALVE (MISCELLANEOUS) ×2 IMPLANT
CHLORAPREP W/TINT 26 (MISCELLANEOUS) ×2 IMPLANT
CHOLANGIOGRAM CATH TAUT (CATHETERS) IMPLANT
CLIP APPLIE 5 13 M/L LIGAMAX5 (MISCELLANEOUS) ×1 IMPLANT
COVER WAND RF STERILE (DRAPES) ×2 IMPLANT
DECANTER SPIKE VIAL GLASS SM (MISCELLANEOUS) ×4 IMPLANT
DERMABOND ADVANCED (GAUZE/BANDAGES/DRESSINGS) ×1
DERMABOND ADVANCED .7 DNX12 (GAUZE/BANDAGES/DRESSINGS) ×1 IMPLANT
DRAPE C-ARM XRAY 36X54 (DRAPES) ×4 IMPLANT
ELECT CAUTERY BLADE 6.4 (BLADE) ×3 IMPLANT
ELECT REM PT RETURN 9FT ADLT (ELECTROSURGICAL) ×2
ELECTRODE REM PT RTRN 9FT ADLT (ELECTROSURGICAL) ×1 IMPLANT
GLOVE BIO SURGEON STRL SZ7 (GLOVE) ×2 IMPLANT
GOWN STRL REUS W/ TWL LRG LVL3 (GOWN DISPOSABLE) ×3 IMPLANT
GOWN STRL REUS W/TWL LRG LVL3 (GOWN DISPOSABLE) ×3
IRRIGATION STRYKERFLOW (MISCELLANEOUS) ×1 IMPLANT
IRRIGATOR STRYKERFLOW (MISCELLANEOUS) ×2
IV CATH ANGIO 12GX3 LT BLUE (NEEDLE) IMPLANT
IV NS 1000ML (IV SOLUTION) ×1
IV NS 1000ML BAXH (IV SOLUTION) ×1 IMPLANT
L-HOOK LAP DISP 36CM (ELECTROSURGICAL) ×2
LHOOK LAP DISP 36CM (ELECTROSURGICAL) ×1 IMPLANT
NEEDLE HYPO 22GX1.5 SAFETY (NEEDLE) ×2 IMPLANT
NS IRRIG 500ML POUR BTL (IV SOLUTION) ×2 IMPLANT
PACK LAP CHOLECYSTECTOMY (MISCELLANEOUS) ×2 IMPLANT
PENCIL ELECTRO HAND CTR (MISCELLANEOUS) ×2 IMPLANT
POUCH SPECIMEN RETRIEVAL 10MM (ENDOMECHANICALS) ×2 IMPLANT
SCISSORS METZENBAUM CVD 33 (INSTRUMENTS) ×2 IMPLANT
SET TUBE SMOKE EVAC HIGH FLOW (TUBING) ×2 IMPLANT
SLEEVE ENDOPATH XCEL 5M (ENDOMECHANICALS) ×4 IMPLANT
SPONGE LAP 18X18 RF (DISPOSABLE) ×2 IMPLANT
STOPCOCK 4 WAY LG BORE MALE ST (IV SETS) IMPLANT
SUT ETHIBOND 0 MO6 C/R (SUTURE) IMPLANT
SUT MNCRL AB 4-0 PS2 18 (SUTURE) ×2 IMPLANT
SUT VICRYL 0 AB UR-6 (SUTURE) ×4 IMPLANT
SYR 20CC LL (SYRINGE) ×2 IMPLANT
TROCAR XCEL BLUNT TIP 100MML (ENDOMECHANICALS) ×2 IMPLANT
TROCAR XCEL NON-BLD 5MMX100MML (ENDOMECHANICALS) ×2 IMPLANT

## 2019-03-19 NOTE — Discharge Instructions (Signed)
In addition to included general post-operative instructions for laparoscopic cholecystectomy,  Diet: Resume home diet, may consider low fat diet for first few days while body adjust to not having gallbladder.    Activity: No heavy lifting >20 pounds (children, pets, laundry, garbage) or strenuous activity until follow-up in 2 weeks, but light activity and walking are encouraged. Do not drive or drink alcohol if taking narcotic pain medications or having pain that might distract from driving.  Wound care: 2 days after surgery (06/25), you may shower/get incision wet with soapy water and pat dry (do not rub incisions), but no baths or submerging incision underwater until follow-up.   Medications: Resume all home medications. For mild to moderate pain: acetaminophen (Tylenol) or ibuprofen/naproxen (if no kidney disease). Combining Tylenol with alcohol can substantially increase your risk of causing liver disease. Narcotic pain medications, if prescribed, can be used for severe pain, though may cause nausea, constipation, and drowsiness. Do not combine Tylenol and Percocet (or similar) within a 6 hour period as Percocet (and similar) contain(s) Tylenol. If you do not need the narcotic pain medication, you do not need to fill the prescription.  Call office [phone] / [phone]) at any time if any questions, worsening pain, fevers/chills, bleeding, drainage from incision site, or other concerns.

## 2019-03-19 NOTE — Anesthesia Postprocedure Evaluation (Signed)
Anesthesia Post Note  Patient: Christina Reyes  Procedure(s) Performed: LAPAROSCOPIC CHOLECYSTECTOMY (N/A )  Patient location during evaluation: PACU Anesthesia Type: General Level of consciousness: awake and alert Pain management: pain level controlled Vital Signs Assessment: post-procedure vital signs reviewed and stable Respiratory status: spontaneous breathing, nonlabored ventilation, respiratory function stable and patient connected to nasal cannula oxygen Cardiovascular status: blood pressure returned to baseline and stable Postop Assessment: no apparent nausea or vomiting Anesthetic complications: no     Last Vitals:  Vitals:   03/19/19 1102 03/19/19 1128  BP: 124/80 (!) 141/65  Pulse: 65 64  Resp: 16   Temp: 36.4 C 36.4 C  SpO2: 94% 96%    Last Pain:  Vitals:   03/19/19 1205  TempSrc:   PainSc: 3                  Coral Timme S

## 2019-03-19 NOTE — Anesthesia Procedure Notes (Signed)
Procedure Name: Intubation Date/Time: 03/19/2019 10:33 AM Performed by: Caryl Asp, CRNA Pre-anesthesia Checklist: Patient identified, Patient being monitored, Timeout performed, Emergency Drugs available and Suction available Patient Re-evaluated:Patient Re-evaluated prior to induction Oxygen Delivery Method: Circle system utilized Preoxygenation: Pre-oxygenation with 100% oxygen Induction Type: IV induction Ventilation: Mask ventilation without difficulty Laryngoscope Size: Mac and 3 Grade View: Grade II Tube type: Oral Tube size: 7.0 mm Number of attempts: 2 Airway Equipment and Method: Stylet Placement Confirmation: ETT inserted through vocal cords under direct vision,  positive ETCO2 and breath sounds checked- equal and bilateral Secured at: 22 cm Tube secured with: Tape Dental Injury: Teeth and Oropharynx as per pre-operative assessment

## 2019-03-19 NOTE — Anesthesia Preprocedure Evaluation (Signed)
Anesthesia Evaluation  Patient identified by MRN, date of birth, ID band Patient awake    Reviewed: Allergy & Precautions, NPO status , Patient's Chart, lab work & pertinent test results, reviewed documented beta blocker date and time   Airway Mallampati: II  TM Distance: >3 FB     Dental  (+) Chipped   Pulmonary Current Smoker,           Cardiovascular      Neuro/Psych  Headaches, PSYCHIATRIC DISORDERS Anxiety Depression    GI/Hepatic GERD  Controlled,  Endo/Other    Renal/GU      Musculoskeletal   Abdominal   Peds  Hematology   Anesthesia Other Findings   Reproductive/Obstetrics                             Anesthesia Physical Anesthesia Plan  ASA: II  Anesthesia Plan: General   Post-op Pain Management:    Induction: Intravenous  PONV Risk Score and Plan:   Airway Management Planned: Oral ETT  Additional Equipment:   Intra-op Plan:   Post-operative Plan:   Informed Consent: I have reviewed the patients History and Physical, chart, labs and discussed the procedure including the risks, benefits and alternatives for the proposed anesthesia with the patient or authorized representative who has indicated his/her understanding and acceptance.       Plan Discussed with: CRNA  Anesthesia Plan Comments:         Anesthesia Quick Evaluation

## 2019-03-19 NOTE — Op Note (Signed)
Laparoscopic Cholecystectomy  Pre-operative Diagnosis: Cholecystitis  Post-operative Diagnosis: same  Procedure: laparoscopic cholecystectomy  Surgeon: Caroleen Hamman, MD FACS  Anesthesia: Gen. with endotracheal tube   Findings: Acute Cholecystitis   Estimated Blood Loss: 10cc                 Specimens: Gallbladder           Complications: none   Procedure Details  The patient was seen again in the Holding Room. The benefits, complications, treatment options, and expected outcomes were discussed with the patient. The risks of bleeding, infection, recurrence of symptoms, failure to resolve symptoms, bile duct damage, bile duct leak, retained common bile duct stone, bowel injury, any of which could require further surgery and/or ERCP, stent, or papillotomy were reviewed with the patient. The likelihood of improving the patient's symptoms with return to their baseline status is good.  The patient and/or family concurred with the proposed plan, giving informed consent.  The patient was taken to Operating Room, identified as Christina Reyes and the procedure verified as Laparoscopic Cholecystectomy.  A Time Out was held and the above information confirmed.  Prior to the induction of general anesthesia, antibiotic prophylaxis was administered. VTE prophylaxis was in place. General endotracheal anesthesia was then administered and tolerated well. After the induction, the abdomen was prepped with Chloraprep and draped in the sterile fashion. The patient was positioned in the supine position.  Cut down technique was used to enter the abdominal cavity and a Hasson trochar was placed after two vicryl stitches were anchored to the fascia. Pneumoperitoneum was then created with CO2 and tolerated well without any adverse changes in the patient's vital signs.  Three 5-mm ports were placed in the right upper quadrant all under direct vision. All skin incisions  were infiltrated with a local anesthetic agent  before making the incision and placing the trocars.   The patient was positioned  in reverse Trendelenburg, tilted slightly to the patient's left.  The gallbladder was identified, the fundus grasped and retracted cephalad. Adhesions were lysed bluntly. The infundibulum was grasped and retracted laterally, exposing the peritoneum overlying the triangle of Calot. This was then divided and exposed in a blunt fashion. An extended critical view of the cystic duct and cystic artery was obtained.  The cystic duct was clearly identified and bluntly dissected.   Artery and duct were double clipped and divided.  The gallbladder was taken from the gallbladder fossa in a retrograde fashion with the electrocautery. The gallbladder was removed and placed in an Endocatch bag. The liver bed was irrigated and inspected. Hemostasis was achieved with the electrocautery. Copious irrigation was utilized and was repeatedly aspirated until clear.  The gallbladder and Endocatch sac were then removed through a port site.   Inspection of the right upper quadrant was performed. No bleeding, bile duct injury or leak, or bowel injury was noted. Pneumoperitoneum was released.  The periumbilical port site was closed with interrumpted 0 Vicryl sutures. 4-0 subcuticular Monocryl was used to close the skin. Dermabond was  applied.  The patient was then extubated and brought to the recovery room in stable condition. Sponge, lap, and needle counts were correct at closure and at the conclusion of the case.               Caroleen Hamman, MD, FACS

## 2019-03-19 NOTE — Transfer of Care (Signed)
Immediate Anesthesia Transfer of Care Note  Patient: Christina Reyes  Procedure(s) Performed: LAPAROSCOPIC CHOLECYSTECTOMY (N/A )  Patient Location: PACU  Anesthesia Type:General  Level of Consciousness: awake, alert  and oriented  Airway & Oxygen Therapy: Patient Spontanous Breathing and Patient connected to face mask oxygen  Post-op Assessment: Report given to RN, Post -op Vital signs reviewed and stable and Patient moving all extremities X 4  Post vital signs: Reviewed and stable  Last Vitals:  Vitals Value Taken Time  BP 122/59 03/19/19 0952  Temp    Pulse 68 03/19/19 0953  Resp 20 03/19/19 0953  SpO2 100 % 03/19/19 0953  Vitals shown include unvalidated device data.  Last Pain:  Vitals:   03/19/19 0952  TempSrc:   PainSc: (P) Asleep         Complications: No apparent anesthesia complications

## 2019-03-19 NOTE — Progress Notes (Signed)
MD ordered patient to be discharged home.  Discharge instructions were reviewed with the patient and she voiced understanding.  Follow-up appointment was made.  No prescriptions given to the patient.  IV was removed with catheter intact.  All patients questions were answered.  Patient left via wheelchair escorted by nursing.

## 2019-03-19 NOTE — Anesthesia Post-op Follow-up Note (Signed)
Anesthesia QCDR form completed.        
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
## 2019-03-20 LAB — HIV ANTIBODY (ROUTINE TESTING W REFLEX): HIV Screen 4th Generation wRfx: NONREACTIVE

## 2019-03-20 LAB — SURGICAL PATHOLOGY

## 2019-03-25 ENCOUNTER — Encounter: Payer: Self-pay | Admitting: Obstetrics and Gynecology

## 2019-03-27 ENCOUNTER — Ambulatory Visit
Admission: RE | Admit: 2019-03-27 | Discharge: 2019-03-27 | Disposition: A | Discharge status: Home / Self Care | Payer: No Typology Code available for payment source | Source: Ambulatory Visit | Attending: Chiropractor | Admitting: Chiropractor

## 2019-03-27 ENCOUNTER — Other Ambulatory Visit: Payer: Self-pay

## 2019-03-27 ENCOUNTER — Other Ambulatory Visit: Payer: Self-pay | Admitting: Chiropractor

## 2019-03-27 DIAGNOSIS — R52 Pain, unspecified: Secondary | ICD-10-CM | POA: Insufficient documentation

## 2019-04-01 ENCOUNTER — Encounter: Payer: Self-pay | Admitting: Surgery

## 2019-04-01 ENCOUNTER — Other Ambulatory Visit: Payer: Self-pay

## 2019-04-01 ENCOUNTER — Ambulatory Visit (INDEPENDENT_AMBULATORY_CARE_PROVIDER_SITE_OTHER): Payer: Self-pay | Admitting: Surgery

## 2019-04-01 VITALS — BP 142/89 | HR 86 | Temp 97.7°F | Resp 16 | Ht 61.0 in | Wt 107.0 lb

## 2019-04-01 DIAGNOSIS — Z09 Encounter for follow-up examination after completed treatment for conditions other than malignant neoplasm: Secondary | ICD-10-CM

## 2019-04-01 NOTE — Patient Instructions (Signed)

## 2019-04-01 NOTE — Progress Notes (Signed)
S/p lap chole Doing well No complaints Taking PO, no Fevers or chills  PE NAD Abd: soft, NT . Incisions c/d/i  A/p Doing well No complications Path d/w pt No heavy lifting RTC prn

## 2019-04-17 ENCOUNTER — Other Ambulatory Visit: Payer: Self-pay | Admitting: Obstetrics and Gynecology

## 2019-04-17 DIAGNOSIS — F988 Other specified behavioral and emotional disorders with onset usually occurring in childhood and adolescence: Secondary | ICD-10-CM

## 2019-04-17 MED ORDER — AMPHETAMINE-DEXTROAMPHETAMINE 10 MG PO TABS: 10.0000 mg | ORAL_TABLET | Freq: Two times a day (BID) | ORAL | 0 refills | Status: DC

## 2019-04-17 NOTE — Progress Notes (Signed)
Rx RF adderrall. Annual due 12/20

## 2019-04-17 NOTE — Telephone Encounter (Signed)
Please advise 

## 2019-04-19 ENCOUNTER — Other Ambulatory Visit: Payer: Self-pay | Admitting: Obstetrics and Gynecology

## 2019-04-19 DIAGNOSIS — B001 Herpesviral vesicular dermatitis: Secondary | ICD-10-CM

## 2019-04-19 MED ORDER — VALACYCLOVIR HCL 1 G PO TABS: ORAL_TABLET | ORAL | 1 refills | Status: DC

## 2019-04-19 NOTE — Progress Notes (Signed)
Rx RF valtrex for cold sores.  

## 2019-05-27 ENCOUNTER — Other Ambulatory Visit: Payer: Self-pay | Admitting: Obstetrics and Gynecology

## 2019-05-27 DIAGNOSIS — F988 Other specified behavioral and emotional disorders with onset usually occurring in childhood and adolescence: Secondary | ICD-10-CM

## 2019-05-27 NOTE — Telephone Encounter (Signed)
Advise

## 2019-07-30 ENCOUNTER — Other Ambulatory Visit: Payer: Self-pay | Admitting: Obstetrics and Gynecology

## 2019-07-30 DIAGNOSIS — F988 Other specified behavioral and emotional disorders with onset usually occurring in childhood and adolescence: Secondary | ICD-10-CM

## 2019-07-30 MED ORDER — AMPHETAMINE-DEXTROAMPHETAMINE 10 MG PO TABS: 10.0000 mg | ORAL_TABLET | Freq: Two times a day (BID) | ORAL | 0 refills | Status: DC

## 2019-07-30 NOTE — Progress Notes (Signed)
Rx RF adderall. 

## 2019-08-29 ENCOUNTER — Other Ambulatory Visit: Payer: Self-pay | Admitting: Obstetrics and Gynecology

## 2019-08-29 DIAGNOSIS — F988 Other specified behavioral and emotional disorders with onset usually occurring in childhood and adolescence: Secondary | ICD-10-CM

## 2019-08-29 MED ORDER — AMPHETAMINE-DEXTROAMPHETAMINE 10 MG PO TABS: 10.0000 mg | ORAL_TABLET | Freq: Two times a day (BID) | ORAL | 0 refills | Status: DC

## 2019-08-29 NOTE — Telephone Encounter (Signed)
Please see

## 2019-08-29 NOTE — Telephone Encounter (Signed)
advise

## 2019-09-08 NOTE — Progress Notes (Deleted)
PCP: Patient, No Pcp Per   No chief complaint on file.   HPI:      [name] is a 53 y.o. [id number] who LMP was Patient's last menstrual period was 11/25/2015 (approximate)., presents today for her annual examination.  Her menses are absent since 12/17. She does not have intermenstrual bleeding.  She does have vasomotor sx, night sweats worse than hot flashes.   Sex activity: single partner, contraception - post menopausal status. She does have vaginal dryness, uses lubricants.  Last Pap: March 17, 2016  Results were: no abnormalities /neg HPV DNA.  Hx of STDs: none  Last mammogram: April 01, 2016  Results were: normal--routine follow-up in 12 months There is no FH of breast cancer. There is no FH of ovarian cancer. The patient does do self-breast exams.  Colonoscopy: not recent; wants to do when has insurance. Has hx of hemorrhoids, occas BRBPR with hemorrhoids. Has constipation/diarrhea episodes.  Tobacco use: The patient currently smokes 1/4 packs of cigarettes per day for the past many years. She is planning to quit.  Alcohol use: none Exercise: moderately active  She does get adequate calcium but Vitamin D in her diet.  She takes prozac occas for anxiety sx with sx relief. She doesn't need Rx RF today.  She needs RF on valtrex for cold sores.  She takes adderall BID for concentration with improvement most days.  Past Medical History:  Diagnosis Date  . Abnormal Pap smear of cervix 09/10/1993   LGSIL  . ADD (attention deficit disorder)   . Depression   . Ear pain   . GERD (gastroesophageal reflux disease)   . Head ache 10/26/2012  . History of mammogram 02/14/2015; 04/01/2016   BIRAD 1; NEG  . History of Papanicolaou smear of cervix 10/26/2012; 03/17/2016   -/-; -/-  . 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 Seasonal allergies   . Syncope and collapse 10/26/2012    Past Surgical History:  Procedure Laterality Date  . BREAST BIOPSY Left    CORE - NEG  . CHOLECYSTECTOMY N/A  03/19/2019   Procedure: LAPAROSCOPIC CHOLECYSTECTOMY;  Surgeon: Jules Husbands, MD;  Location: [facility] ORS;  Service: General;  Laterality: N/A;  . TONSILLECTOMY AND ADENOIDECTOMY      Family History  Problem Relation Age of Onset  . Diabetes Mother        TYPE 2  . Breast cancer Neg Hx     Social History   Socioeconomic History  . Marital status: Divorced    Spouse name: Not on file  . Number of children: 2  . Years of education: Not on file  . Highest education level: Not on file  Occupational History  . Not on file  Tobacco Use  . Smoking status: Current Some Day Smoker    Packs/day: 0.25    Types: Cigarettes  . Smokeless tobacco: Never Used  Substance and Sexual Activity  . Alcohol use: Yes    Comment: very rare  . Drug use: No  . Sexual activity: Yes    Birth control/protection: None, Post-menopausal  Other Topics Concern  . Not on file  Social History Narrative  . Not on file   Social Determinants of Health   Financial Resource Strain:   . Difficulty of Paying Living Expenses: Not on file  Food Insecurity:   . Worried About Charity fundraiser in the Last Year: Not on file  . Ran Out of Food in the Last Year: Not on file  Transportation Needs:   . Freight forwarder (Medical): Not on file  . Lack of Transportation (Non-Medical): Not on file  Physical Activity:   . Days of Exercise per Week: Not on file  . Minutes of Exercise per Session: Not on file  Stress:   . Feeling of Stress : Not on file  Social Connections:   . Frequency of Communication with Friends and Family: Not on file  . Frequency of Social Gatherings with Friends and Family: Not on file  . Attends Religious Services: Not on file  . Active Member of Clubs or Organizations: Not on file  . Attends Banker Meetings: Not on file  . Marital Status: Not on file  Intimate Partner Violence:   . Fear of Current or Ex-Partner: Not on file  . Emotionally Abused: Not on file  .  Physically Abused: Not on file  . Sexually Abused: Not on file    No outpatient medications have been marked as taking for the 09/09/19 encounter (Appointment) with Hansford Hirt, Ilona Sorrel, PA-C.      ROS:  Review of Systems  Constitutional: Negative for fatigue, fever and unexpected weight change.  Respiratory: Negative for cough, shortness of breath and wheezing.   Cardiovascular: Negative for chest pain, palpitations and leg swelling.  Gastrointestinal: Negative for blood in stool, constipation, diarrhea, nausea and vomiting.  Endocrine: Negative for cold intolerance, heat intolerance and polyuria.  Genitourinary: Negative for dyspareunia, dysuria, flank pain, frequency, genital sores, hematuria, menstrual problem, pelvic pain, urgency, vaginal bleeding, vaginal discharge and vaginal pain.  Musculoskeletal: Negative for back pain, joint swelling and myalgias.  Skin: Negative for rash.  Neurological: Negative for dizziness, syncope, light-headedness, numbness and headaches.  Hematological: Negative for adenopathy.  Psychiatric/Behavioral: Positive for agitation and dysphoric mood. Negative for confusion, sleep disturbance and suicidal ideas. The patient is not nervous/anxious.      Objective: LMP 11/25/2015 (Approximate)    Physical Exam Constitutional:      Appearance: She is well-developed.  Genitourinary:     Vagina and uterus normal.     No vaginal discharge, erythema or tenderness.     No cervical motion tenderness or polyp.     Uterus is not enlarged or tender.     No right or left adnexal mass present.     Right adnexa not tender.     Left adnexa not tender.  Rectum:     Guaiac result negative.     External hemorrhoid present.     No anal fissure or tenderness.  Neck:     Thyroid: No thyromegaly.  Cardiovascular:     Rate and Rhythm: Normal rate and regular rhythm.     Heart sounds: Normal heart sounds. No murmur.  Pulmonary:     Effort: Pulmonary effort is  normal.     Breath sounds: Normal breath sounds.  Chest:     Breasts:        Right: No mass, nipple discharge, skin change or tenderness.        Left: No mass, nipple discharge, skin change or tenderness.  Abdominal:     Palpations: Abdomen is soft.     Tenderness: There is no abdominal tenderness. There is no guarding.  Musculoskeletal:        General: Normal range of motion.     Cervical back: Normal range of motion.  Neurological:     Mental Status: She is alert and oriented to person, place, and time.     Cranial Nerves:  No cranial nerve deficit.  Psychiatric:        Behavior: Behavior normal.  Vitals reviewed.     Results: No results found for this or any previous visit (from the past 24 hour(s)).  Assessment/Plan:  No diagnosis found.   No orders of the defined types were placed in this encounter.          GYN counsel mammography screening, menopause, adequate intake of calcium and vitamin D, diet and exercise    F/U  No follow-ups on file.  Kristy Catoe B. Neveyah Garzon, PA-C 09/08/2019 8:38 PM

## 2019-09-09 ENCOUNTER — Ambulatory Visit: Payer: Self-pay | Admitting: Obstetrics and Gynecology

## 2019-09-26 ENCOUNTER — Other Ambulatory Visit: Payer: Self-pay | Admitting: Obstetrics and Gynecology

## 2019-09-26 DIAGNOSIS — F988 Other specified behavioral and emotional disorders with onset usually occurring in childhood and adolescence: Secondary | ICD-10-CM

## 2019-09-27 MED ORDER — AMPHETAMINE-DEXTROAMPHETAMINE 10 MG PO TABS: 10.0000 mg | ORAL_TABLET | Freq: Two times a day (BID) | ORAL | 0 refills | Status: DC
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
## 2019-11-01 ENCOUNTER — Other Ambulatory Visit: Payer: Self-pay | Admitting: Obstetrics and Gynecology

## 2019-11-01 DIAGNOSIS — F988 Other specified behavioral and emotional disorders with onset usually occurring in childhood and adolescence: Secondary | ICD-10-CM

## 2019-11-01 NOTE — Telephone Encounter (Signed)
advise

## 2019-11-04 ENCOUNTER — Telehealth: Payer: Self-pay

## 2019-11-04 ENCOUNTER — Other Ambulatory Visit: Payer: Self-pay | Admitting: Obstetrics and Gynecology

## 2019-11-04 DIAGNOSIS — F988 Other specified behavioral and emotional disorders with onset usually occurring in childhood and adolescence: Secondary | ICD-10-CM

## 2019-11-04 MED ORDER — AMPHETAMINE-DEXTROAMPHETAMINE 10 MG PO TABS: 10.0000 mg | ORAL_TABLET | Freq: Two times a day (BID) | ORAL | 0 refills | Status: DC

## 2019-11-04 NOTE — Progress Notes (Signed)
Rx RF adderall until 2/21 annual 

## 2019-11-04 NOTE — Telephone Encounter (Signed)
Pt requesting refill on adderal. Has appt. Scheduled

## 2019-11-04 NOTE — Telephone Encounter (Signed)
Rx eRxd.  

## 2019-11-04 NOTE — Telephone Encounter (Signed)
Pt aware.

## 2019-11-20 NOTE — Progress Notes (Signed)
PCP: Patient, No Pcp Per   Chief Complaint  Patient presents with  . Gynecologic Exam    HPI:      [name] is a 54 y.o. [id number] who LMP was Patient's last menstrual period was 11/25/2015 (approximate)., presents today for her annual examination.  Her menses are absent due to menopause. She does not have postmenopausal bleeding.  She no longer has vasomotor sx.  Sex activity: single partner, contraception - post menopausal status. She does have vaginal dryness, uses lubricants.  Last Pap: March 17, 2016  Results were: no abnormalities /neg HPV DNA.  Hx of STDs: HPV on pap in distant past  Last mammogram: April 01, 2016  Results were: normal--routine follow-up in 12 months. Pt hasn't done it due to no insurance. Will try to do through [employer] this yr. There is no FH of breast cancer. There is no FH of ovarian cancer. The patient does do self-breast exams.  Colonoscopy: not recent; wants to do when has insurance. Has hx of hemorrhoids. Interested in [employer].   Tobacco use: The patient currently smokes 1/4 packs of cigarettes per day for the past many years. She knows she needs to quit.  Alcohol use: none  No drug use. Exercise: moderately active  She does get adequate calcium but not Vitamin D in her diet.  She takes prozac occas for anxiety sx with sx relief. She needs Rx RF She needs RF on valtrex for cold sores.  She takes adderall BID for concentration with improvement most days.  Past Medical History:  Diagnosis Date  . Abnormal Pap smear of cervix 09/10/1993   LGSIL  . ADD (attention deficit disorder)   . Depression   . Ear pain   . GERD (gastroesophageal reflux disease)   . Head ache 10/26/2012  . History of mammogram 02/14/2015; 04/01/2016   BIRAD 1; NEG  . History of Papanicolaou smear of cervix 10/26/2012; 03/17/2016   -/-; -/-  . 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 Seasonal allergies   . Syncope and collapse 10/26/2012    Past Surgical History:   Procedure Laterality Date  . BREAST BIOPSY Left    CORE - NEG  . CHOLECYSTECTOMY N/A 03/19/2019   Procedure: LAPAROSCOPIC CHOLECYSTECTOMY;  Surgeon: Jules Husbands, MD;  Location: [facility] ORS;  Service: General;  Laterality: N/A;  . TONSILLECTOMY AND ADENOIDECTOMY      Family History  Problem Relation Age of Onset  . Diabetes Mother        TYPE 2  . Breast cancer Neg Hx     Social History   Socioeconomic History  . Marital status: Divorced    Spouse name: Not on file  . Number of children: 2  . Years of education: Not on file  . Highest education level: Not on file  Occupational History  . Not on file  Tobacco Use  . Smoking status: Current Some Day Smoker    Packs/day: 0.25    Types: Cigarettes  . Smokeless tobacco: Never Used  Substance and Sexual Activity  . Alcohol use: Yes    Comment: very rare  . Drug use: No  . Sexual activity: Yes    Birth control/protection: None, Post-menopausal  Other Topics Concern  . Not on file  Social History Narrative  . Not on file   Social Determinants of Health   Financial Resource Strain:   . Difficulty of Paying Living Expenses: Not on file  Food Insecurity:   . Worried About  Running Out of Food in the Last Year: Not on file  . Ran Out of Food in the Last Year: Not on file  Transportation Needs:   . Lack of Transportation (Medical): Not on file  . Lack of Transportation (Non-Medical): Not on file  Physical Activity:   . Days of Exercise per Week: Not on file  . Minutes of Exercise per Session: Not on file  Stress:   . Feeling of Stress : Not on file  Social Connections:   . Frequency of Communication with Friends and Family: Not on file  . Frequency of Social Gatherings with Friends and Family: Not on file  . Attends Religious Services: Not on file  . Active Member of Clubs or Organizations: Not on file  . Attends Banker Meetings: Not on file  . Marital Status: Not on file  Intimate Partner Violence:   .  Fear of Current or Ex-Partner: Not on file  . Emotionally Abused: Not on file  . Physically Abused: Not on file  . Sexually Abused: Not on file    Current Meds  Medication Sig  . amphetamine-dextroamphetamine (ADDERALL) 10 MG tablet Take 1 tablet (10 mg total) by mouth 2 (two) times daily with a meal. Can fill after 02/01/20  . cetirizine (ZYRTEC) 10 MG tablet Take 10 mg by mouth daily.  Marland Kitchen FLUoxetine (PROZAC) 10 MG tablet Take 1 tablet (10 mg total) by mouth daily.  Marland Kitchen ibuprofen (ADVIL,MOTRIN) 200 MG tablet Take 200 mg by mouth every 6 (six) hours as needed.   . valACYclovir (VALTREX) 1000 MG tablet Take 2 tabs BID x 1 day prn sx  . [DISCONTINUED] amphetamine-dextroamphetamine (ADDERALL) 10 MG tablet Take 1 tablet (10 mg total) by mouth 2 (two) times daily with a meal.  . [DISCONTINUED] amphetamine-dextroamphetamine (ADDERALL) 10 MG tablet Take 1 tablet (10 mg total) by mouth 2 (two) times daily with a meal. Can fill after 12/02/19  . [DISCONTINUED] amphetamine-dextroamphetamine (ADDERALL) 10 MG tablet Take 1 tablet (10 mg total) by mouth 2 (two) times daily with a meal. Can fill after 01/02/20  . [DISCONTINUED] FLUoxetine (PROZAC) 10 MG tablet Take 1 tablet (10 mg total) by mouth daily.  . [DISCONTINUED] valACYclovir (VALTREX) 1000 MG tablet Take 2 tabs BID x 1 day prn sx      ROS:  Review of Systems  Constitutional: Negative for fatigue, fever and unexpected weight change.  Respiratory: Negative for cough, shortness of breath and wheezing.   Cardiovascular: Negative for chest pain, palpitations and leg swelling.  Gastrointestinal: Negative for blood in stool, constipation, diarrhea, nausea and vomiting.  Endocrine: Negative for cold intolerance, heat intolerance and polyuria.  Genitourinary: Negative for dyspareunia, dysuria, flank pain, frequency, genital sores, hematuria, menstrual problem, pelvic pain, urgency, vaginal bleeding, vaginal discharge and vaginal pain.  Musculoskeletal:  Negative for back pain, joint swelling and myalgias.  Skin: Negative for rash.  Neurological: Positive for numbness. Negative for dizziness, syncope, light-headedness and headaches.  Hematological: Negative for adenopathy.  Psychiatric/Behavioral: Negative for agitation, confusion, dysphoric mood, sleep disturbance and suicidal ideas. The patient is not nervous/anxious.      Objective: BP 126/80   Ht 5\' 1"  (1.549 m)   Wt 107 lb (48.5 kg)   LMP 11/25/2015 (Approximate)   BMI 20.22 kg/m    Physical Exam Constitutional:      Appearance: She is well-developed.  Genitourinary:     Vulva, vagina, uterus, right adnexa and left adnexa normal.     No vulval lesion  or tenderness noted.     No vaginal discharge, erythema or tenderness.     No cervical motion tenderness or polyp.     Uterus is not enlarged or tender.     No right or left adnexal mass present.     Right adnexa not tender.     Left adnexa not tender.  Rectum:     Guaiac result negative.     External hemorrhoid present.     No anal fissure or tenderness.  Neck:     Thyroid: No thyromegaly.  Cardiovascular:     Rate and Rhythm: Normal rate and regular rhythm.     Heart sounds: Normal heart sounds. No murmur.  Pulmonary:     Effort: Pulmonary effort is normal.     Breath sounds: Normal breath sounds.  Chest:     Breasts:        Right: No mass, nipple discharge, skin change or tenderness.        Left: No mass, nipple discharge, skin change or tenderness.  Abdominal:     Palpations: Abdomen is soft.     Tenderness: There is no abdominal tenderness. There is no guarding.  Musculoskeletal:        General: Normal range of motion.     Cervical back: Normal range of motion.  Neurological:     General: No focal deficit present.     Mental Status: She is alert and oriented to person, place, and time.     Cranial Nerves: No cranial nerve deficit.  Skin:    General: Skin is warm and dry.  Psychiatric:        Mood and  Affect: Mood normal.        Behavior: Behavior normal.        Thought Content: Thought content normal.        Judgment: Judgment normal.  Vitals reviewed.     Assessment/Plan:  Encounter for annual routine gynecological examination  Encounter for screening mammogram for malignant neoplasm of breast - Plan: MM 3D SCREEN BREAST BILATERAL; pt to sched through [employer]  Screening for colon cancer - Plan: Cologuard; Ref sent. See if they have self pay option  Anxiety - Rx RF prozac. Pt takes episodically wtih sx relief.  - Plan: FLUoxetine (PROZAC) 10 MG tablet  Cold sore - Rx RF valtrex prn. - Plan: valACYclovir (VALTREX) 1000 MG tablet  Attention deficit disorder (ADD) without hyperactivity - Rx RF adderall for 3 months  - Plan: amphetamine-dextroamphetamine (ADDERALL) 10 MG tablet   Meds ordered this encounter  Medications  . FLUoxetine (PROZAC) 10 MG tablet    Sig: Take 1 tablet (10 mg total) by mouth daily.    Dispense:  30 tablet    Refill:  5    Order Specific Question:   Supervising Provider    Answer:   Nadara Mustard [id number]  . valACYclovir (VALTREX) 1000 MG tablet    Sig: Take 2 tabs BID x 1 day prn sx    Dispense:  30 tablet    Refill:  1    Order Specific Question:   Supervising Provider    Answer:   Nadara Mustard [id number]  . DISCONTD: amphetamine-dextroamphetamine (ADDERALL) 10 MG tablet    Sig: Take 1 tablet (10 mg total) by mouth 2 (two) times daily with a meal. Can fill after 12/02/19    Dispense:  60 tablet    Refill:  0    Order Specific Question:  Supervising Provider    Answer:   Nadara Mustard [[id number]]  . DISCONTD: amphetamine-dextroamphetamine (ADDERALL) 10 MG tablet    Sig: Take 1 tablet (10 mg total) by mouth 2 (two) times daily with a meal. Can fill after 01/02/20    Dispense:  60 tablet    Refill:  0    Order Specific Question:   Supervising Provider    Answer:   Nadara Mustard [id number]  . amphetamine-dextroamphetamine (ADDERALL)  10 MG tablet    Sig: Take 1 tablet (10 mg total) by mouth 2 (two) times daily with a meal. Can fill after 02/01/20    Dispense:  60 tablet    Refill:  0    Order Specific Question:   Supervising Provider    Answer:   Nadara Mustard [[id number]]           GYN counsel mammography screening, menopause, adequate intake of calcium and vitamin D, diet and exercise    F/U  Return in about 1 year (around 11/20/2020).  Christina Reyes B. Pier Bosher, PA-C 11/21/2019 11:49 AM

## 2019-11-21 ENCOUNTER — Ambulatory Visit (INDEPENDENT_AMBULATORY_CARE_PROVIDER_SITE_OTHER): Payer: Self-pay | Admitting: Obstetrics and Gynecology

## 2019-11-21 ENCOUNTER — Encounter: Payer: Self-pay | Admitting: Obstetrics and Gynecology

## 2019-11-21 ENCOUNTER — Other Ambulatory Visit: Payer: Self-pay

## 2019-11-21 VITALS — BP 126/80 | Ht 61.0 in | Wt 107.0 lb

## 2019-11-21 DIAGNOSIS — F17210 Nicotine dependence, cigarettes, uncomplicated: Secondary | ICD-10-CM

## 2019-11-21 DIAGNOSIS — Z1211 Encounter for screening for malignant neoplasm of colon: Secondary | ICD-10-CM

## 2019-11-21 DIAGNOSIS — Z01419 Encounter for gynecological examination (general) (routine) without abnormal findings: Secondary | ICD-10-CM

## 2019-11-21 DIAGNOSIS — F988 Other specified behavioral and emotional disorders with onset usually occurring in childhood and adolescence: Secondary | ICD-10-CM

## 2019-11-21 DIAGNOSIS — B001 Herpesviral vesicular dermatitis: Secondary | ICD-10-CM

## 2019-11-21 DIAGNOSIS — F419 Anxiety disorder, unspecified: Secondary | ICD-10-CM

## 2019-11-21 DIAGNOSIS — Z1231 Encounter for screening mammogram for malignant neoplasm of breast: Secondary | ICD-10-CM

## 2019-11-21 MED ORDER — AMPHETAMINE-DEXTROAMPHETAMINE 10 MG PO TABS: 10.0000 mg | ORAL_TABLET | Freq: Two times a day (BID) | ORAL | 0 refills | Status: DC

## 2019-11-21 MED ORDER — FLUOXETINE HCL 10 MG PO TABS: 10.0000 mg | ORAL_TABLET | Freq: Every day | ORAL | 5 refills | Status: DC

## 2019-11-21 MED ORDER — VALACYCLOVIR HCL 1 G PO TABS: ORAL_TABLET | ORAL | 1 refills | Status: DC

## 2019-11-21 NOTE — Patient Instructions (Signed)
I value your feedback and entrusting us with your care. If you get a [facility] patient survey, I would appreciate you taking the time to let us know about your experience today. Thank you!  As of September 05, 2019, your lab results will be released to your MyChart immediately, before I even have a chance to see them. Please give me time to review them and contact you if there are any abnormalities. Thank you for your patience.   [facility]: [phone]   *[employer]  

## 2020-01-20 ENCOUNTER — Telehealth: Payer: Self-pay

## 2020-01-20 NOTE — Telephone Encounter (Signed)
Per ABC, call pt to follow up on test, is she planning on completing it? No answer, LVMTRC.

## 2020-03-04 ENCOUNTER — Other Ambulatory Visit: Payer: Self-pay | Admitting: Obstetrics and Gynecology

## 2020-03-04 DIAGNOSIS — F988 Other specified behavioral and emotional disorders with onset usually occurring in childhood and adolescence: Secondary | ICD-10-CM

## 2020-03-05 ENCOUNTER — Other Ambulatory Visit: Payer: Self-pay | Admitting: Obstetrics and Gynecology

## 2020-03-05 DIAGNOSIS — F988 Other specified behavioral and emotional disorders with onset usually occurring in childhood and adolescence: Secondary | ICD-10-CM

## 2020-03-05 MED ORDER — AMPHETAMINE-DEXTROAMPHETAMINE 10 MG PO TABS: ORAL_TABLET | ORAL | 0 refills | Status: DC

## 2020-03-05 NOTE — Progress Notes (Signed)
Rx RF adderall for 3 months 

## 2020-03-05 NOTE — Telephone Encounter (Signed)
Please advise 

## 2020-06-03 ENCOUNTER — Other Ambulatory Visit: Payer: Self-pay | Admitting: Obstetrics and Gynecology

## 2020-06-03 ENCOUNTER — Telehealth: Payer: Self-pay | Admitting: Obstetrics and Gynecology

## 2020-06-03 DIAGNOSIS — F988 Other specified behavioral and emotional disorders with onset usually occurring in childhood and adolescence: Secondary | ICD-10-CM

## 2020-06-03 MED ORDER — AMPHETAMINE-DEXTROAMPHETAMINE 10 MG PO TABS: ORAL_TABLET | ORAL | 0 refills | Status: DC

## 2020-06-03 NOTE — Telephone Encounter (Signed)
Pls call pharm. Pt takes adderall BID. Thx.

## 2020-06-03 NOTE — Progress Notes (Signed)
Rx RF adderall for 3 months 

## 2020-06-03 NOTE — Telephone Encounter (Signed)
Pharmacy calling states two medications sent but have no instructions, if this could be re-sent or called in by provider with directions.

## 2020-06-03 NOTE — Telephone Encounter (Signed)
Called pharmacy and should be good to go now!

## 2020-09-07 ENCOUNTER — Other Ambulatory Visit: Payer: Self-pay | Admitting: Obstetrics and Gynecology

## 2020-09-07 DIAGNOSIS — F988 Other specified behavioral and emotional disorders with onset usually occurring in childhood and adolescence: Secondary | ICD-10-CM

## 2020-09-07 MED ORDER — AMPHETAMINE-DEXTROAMPHETAMINE 10 MG PO TABS: 10.0000 mg | ORAL_TABLET | Freq: Two times a day (BID) | ORAL | 0 refills | Status: DC

## 2020-09-07 NOTE — Progress Notes (Signed)
Rx RF adderall for 3 months. Annual due 2/22

## 2020-11-20 LAB — COLOGUARD: Cologuard: NEGATIVE

## 2020-11-23 ENCOUNTER — Ambulatory Visit: Payer: Self-pay | Admitting: Obstetrics and Gynecology

## 2020-11-23 NOTE — Progress Notes (Deleted)
PCP: Patient, No Pcp Per   No chief complaint on file.   HPI:      [name] is a 55 y.o. [id number] who LMP was Patient's last menstrual period was 11/25/2015 (approximate)., presents today for her annual examination.  Her menses are absent due to menopause. She does not have postmenopausal bleeding.  She no longer has vasomotor sx.  Sex activity: single partner, contraception - post menopausal status. She does have vaginal dryness, uses lubricants.  Last Pap: March 17, 2016  Results were: no abnormalities /neg HPV DNA.  Hx of STDs: HPV on pap in distant past  Last mammogram: April 01, 2016  Results were: normal--routine follow-up in 12 months. Pt hasn't done it due to no insurance. Will try to do through [employer] this yr. There is no FH of breast cancer. There is no FH of ovarian cancer. The patient does do self-breast exams.  Colonoscopy: not recent; wants to do when has insurance. Has hx of hemorrhoids. Interested in [employer].   Tobacco use: The patient currently smokes 1/4 packs of cigarettes per day for the past many years. She knows she needs to quit.  Alcohol use: none  No drug use. Exercise: moderately active  She does get adequate calcium but not Vitamin D in her diet.  She takes prozac occas for anxiety sx with sx relief. She needs Rx RF She needs RF on valtrex for cold sores.  She takes adderall BID for concentration with improvement most days.  Past Medical History:  Diagnosis Date  . Abnormal Pap smear of cervix 09/10/1993   LGSIL  . ADD (attention deficit disorder)   . Depression   . Ear pain   . GERD (gastroesophageal reflux disease)   . Head ache 10/26/2012  . History of mammogram 02/14/2015; 04/01/2016   BIRAD 1; NEG  . History of Papanicolaou smear of cervix 10/26/2012; 03/17/2016   -/-; -/-  . 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 Seasonal allergies   . Syncope and collapse 10/26/2012    Past Surgical History:  Procedure Laterality Date  . BREAST  BIOPSY Left    CORE - NEG  . CHOLECYSTECTOMY N/A 03/19/2019   Procedure: LAPAROSCOPIC CHOLECYSTECTOMY;  Surgeon: Leafy Ro, MD;  Location: [facility] ORS;  Service: General;  Laterality: N/A;  . TONSILLECTOMY AND ADENOIDECTOMY      Family History  Problem Relation Age of Onset  . Diabetes Mother        TYPE 2  . Breast cancer Neg Hx     Social History   Socioeconomic History  . Marital status: Divorced    Spouse name: Not on file  . Number of children: 2  . Years of education: Not on file  . Highest education level: Not on file  Occupational History  . Not on file  Tobacco Use  . Smoking status: Current Some Day Smoker    Packs/day: 0.25    Types: Cigarettes  . Smokeless tobacco: Never Used  Vaping Use  . Vaping Use: Never used  Substance and Sexual Activity  . Alcohol use: Yes    Comment: very rare  . Drug use: No  . Sexual activity: Yes    Birth control/protection: None, Post-menopausal  Other Topics Concern  . Not on file  Social History Narrative  . Not on file   Social Determinants of Health   Financial Resource Strain: Not on file  Food Insecurity: Not on file  Transportation Needs: Not on file  Physical Activity: Not on file  Stress: Not on file  Social Connections: Not on file  Intimate Partner Violence: Not on file    No outpatient medications have been marked as taking for the 11/23/20 encounter (Appointment) with Maxie Slovacek, Ilona Sorrel, PA-C.      ROS:  Review of Systems  Constitutional: Negative for fatigue, fever and unexpected weight change.  Respiratory: Negative for cough, shortness of breath and wheezing.   Cardiovascular: Negative for chest pain, palpitations and leg swelling.  Gastrointestinal: Negative for blood in stool, constipation, diarrhea, nausea and vomiting.  Endocrine: Negative for cold intolerance, heat intolerance and polyuria.  Genitourinary: Negative for dyspareunia, dysuria, flank pain, frequency, genital sores, hematuria,  menstrual problem, pelvic pain, urgency, vaginal bleeding, vaginal discharge and vaginal pain.  Musculoskeletal: Negative for back pain, joint swelling and myalgias.  Skin: Negative for rash.  Neurological: Positive for numbness. Negative for dizziness, syncope, light-headedness and headaches.  Hematological: Negative for adenopathy.  Psychiatric/Behavioral: Negative for agitation, confusion, dysphoric mood, sleep disturbance and suicidal ideas. The patient is not nervous/anxious.      Objective: LMP 11/25/2015 (Approximate)    Physical Exam Constitutional:      Appearance: She is well-developed.  Genitourinary:     Vulva normal.     No vaginal discharge, erythema or tenderness.      Right Adnexa: not tender and no mass present.    Left Adnexa: not tender and no mass present.    No cervical motion tenderness or polyp.     Uterus is not enlarged or tender.  Rectum:     Guaiac result negative.     External hemorrhoid present.     No anal fissure or tenderness.  Breasts:     Right: No mass, nipple discharge, skin change or tenderness.     Left: No mass, nipple discharge, skin change or tenderness.    Neck:     Thyroid: No thyromegaly.  Cardiovascular:     Rate and Rhythm: Normal rate and regular rhythm.     Heart sounds: Normal heart sounds. No murmur heard.   Pulmonary:     Effort: Pulmonary effort is normal.     Breath sounds: Normal breath sounds.  Abdominal:     Palpations: Abdomen is soft.     Tenderness: There is no abdominal tenderness. There is no guarding.  Musculoskeletal:        General: Normal range of motion.     Cervical back: Normal range of motion.  Neurological:     General: No focal deficit present.     Mental Status: She is alert and oriented to person, place, and time.     Cranial Nerves: No cranial nerve deficit.  Skin:    General: Skin is warm and dry.  Psychiatric:        Mood and Affect: Mood normal.        Behavior: Behavior normal.         Thought Content: Thought content normal.        Judgment: Judgment normal.  Vitals reviewed.     Assessment/Plan:  Encounter for annual routine gynecological examination  Encounter for screening mammogram for malignant neoplasm of breast - Plan: MM 3D SCREEN BREAST BILATERAL; pt to sched through [employer]  Screening for colon cancer - Plan: Cologuard; Ref sent. See if they have self pay option  Anxiety - Rx RF prozac. Pt takes episodically wtih sx relief.  - Plan: FLUoxetine (PROZAC) 10 MG tablet  Cold sore -  Rx RF valtrex prn. - Plan: valACYclovir (VALTREX) 1000 MG tablet  Attention deficit disorder (ADD) without hyperactivity - Rx RF adderall for 3 months  - Plan: amphetamine-dextroamphetamine (ADDERALL) 10 MG tablet   No orders of the defined types were placed in this encounter.          GYN counsel mammography screening, menopause, adequate intake of calcium and vitamin D, diet and exercise    F/U  No follow-ups on file.  Allisyn Kunz B. Tehilla Coffel, PA-C 11/23/2020 9:29 AM

## 2020-12-11 ENCOUNTER — Other Ambulatory Visit: Payer: Self-pay | Admitting: Obstetrics and Gynecology

## 2020-12-11 DIAGNOSIS — F988 Other specified behavioral and emotional disorders with onset usually occurring in childhood and adolescence: Secondary | ICD-10-CM

## 2020-12-11 NOTE — Telephone Encounter (Signed)
Please advise 

## 2020-12-30 ENCOUNTER — Ambulatory Visit (INDEPENDENT_AMBULATORY_CARE_PROVIDER_SITE_OTHER): Payer: Self-pay | Admitting: Obstetrics and Gynecology

## 2020-12-30 ENCOUNTER — Other Ambulatory Visit: Payer: Self-pay

## 2020-12-30 ENCOUNTER — Encounter: Payer: Self-pay | Admitting: Obstetrics and Gynecology

## 2020-12-30 VITALS — BP 130/90 | Ht 61.0 in | Wt 97.0 lb

## 2020-12-30 DIAGNOSIS — F419 Anxiety disorder, unspecified: Secondary | ICD-10-CM

## 2020-12-30 DIAGNOSIS — F988 Other specified behavioral and emotional disorders with onset usually occurring in childhood and adolescence: Secondary | ICD-10-CM

## 2020-12-30 DIAGNOSIS — Z13220 Encounter for screening for lipoid disorders: Secondary | ICD-10-CM

## 2020-12-30 DIAGNOSIS — B001 Herpesviral vesicular dermatitis: Secondary | ICD-10-CM

## 2020-12-30 DIAGNOSIS — Z0000 Encounter for general adult medical examination without abnormal findings: Secondary | ICD-10-CM

## 2020-12-30 DIAGNOSIS — Z1211 Encounter for screening for malignant neoplasm of colon: Secondary | ICD-10-CM

## 2020-12-30 DIAGNOSIS — Z1231 Encounter for screening mammogram for malignant neoplasm of breast: Secondary | ICD-10-CM

## 2020-12-30 DIAGNOSIS — Z124 Encounter for screening for malignant neoplasm of cervix: Secondary | ICD-10-CM

## 2020-12-30 DIAGNOSIS — Z01419 Encounter for gynecological examination (general) (routine) without abnormal findings: Secondary | ICD-10-CM

## 2020-12-30 DIAGNOSIS — Z1151 Encounter for screening for human papillomavirus (HPV): Secondary | ICD-10-CM

## 2020-12-30 MED ORDER — AMPHETAMINE-DEXTROAMPHETAMINE 10 MG PO TABS
ORAL_TABLET | ORAL | 0 refills | Status: DC
Start: 2020-12-30 — End: 2021-04-13

## 2020-12-30 MED ORDER — VALACYCLOVIR HCL 1 G PO TABS: ORAL_TABLET | ORAL | 1 refills | Status: DC

## 2020-12-30 MED ORDER — FLUOXETINE HCL 10 MG PO TABS: 10.0000 mg | ORAL_TABLET | Freq: Every day | ORAL | 5 refills | Status: DC

## 2020-12-30 MED ORDER — AMPHETAMINE-DEXTROAMPHETAMINE 10 MG PO TABS: ORAL_TABLET | ORAL | 0 refills | Status: DC

## 2020-12-30 NOTE — Progress Notes (Signed)
PCP: Patient, No Pcp Per (Inactive)   Chief Complaint  Patient presents with  . Gynecologic Exam    No concerns    HPI:      [name] is a 55 y.o. [id number] who LMP was Patient's last menstrual period was 11/25/2015 (approximate)., presents today for her annual examination.  Her menses are absent due to menopause. She does not have postmenopausal bleeding.  She no longer has vasomotor sx.  Sex activity: not sex active. No vag sx.   Last Pap: March 17, 2016  Results were: no abnormalities /neg HPV DNA.  Hx of STDs: HPV on pap in distant past  Last mammogram: April 01, 2016  Results were: normal--routine follow-up in 12 months. Pt hasn't done it due to no insurance. Will try to do through [employer]. There is no FH of breast cancer. There is no FH of ovarian cancer. The patient does do self-breast exams.  Colonoscopy: not recent; wants to do when has insurance. Has hx of hemorrhoids. Cologuard referral placed last yr but pt states they kept calling her and they could never connect; never received box in mail.   Tobacco use: occas now, has cut down Alcohol use: none  No drug use. Exercise: moderately active  She does get adequate calcium and Vitamin D in her diet. No recent labs.  She takes prozac occas for anxiety sx with sx relief. She needs Rx RF She needs RF on valtrex for cold sores.  She takes adderall BID for concentration with improvement most days.  PT IS SELF PAY  Past Medical History:  Diagnosis Date  . Abnormal Pap smear of cervix 09/10/1993   LGSIL  . ADD (attention deficit disorder)   . Depression   . Ear pain   . GERD (gastroesophageal reflux disease)   . Head ache 10/26/2012  . History of mammogram 02/14/2015; 04/01/2016   BIRAD 1; NEG  . History of Papanicolaou smear of cervix 10/26/2012; 03/17/2016   -/-; -/-  . 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 Seasonal allergies   . Syncope and collapse 10/26/2012    Past Surgical History:  Procedure  Laterality Date  . BREAST BIOPSY Left    CORE - NEG  . CHOLECYSTECTOMY N/A 03/19/2019   Procedure: LAPAROSCOPIC CHOLECYSTECTOMY;  Surgeon: Leafy Ro, MD;  Location: [facility] ORS;  Service: General;  Laterality: N/A;  . TONSILLECTOMY AND ADENOIDECTOMY      Family History  Problem Relation Age of Onset  . Diabetes Mother        TYPE 2  . Breast cancer Neg Hx     Social History   Socioeconomic History  . Marital status: Divorced    Spouse name: Not on file  . Number of children: 2  . Years of education: Not on file  . Highest education level: Not on file  Occupational History  . Not on file  Tobacco Use  . Smoking status: Current Some Day Smoker    Packs/day: 0.25    Types: Cigarettes  . Smokeless tobacco: Never Used  Vaping Use  . Vaping Use: Never used  Substance and Sexual Activity  . Alcohol use: Yes    Comment: very rare  . Drug use: No  . Sexual activity: Not Currently    Birth control/protection: None, Post-menopausal  Other Topics Concern  . Not on file  Social History Narrative  . Not on file   Social Determinants of Health   Financial Resource Strain: Not on file  Food Insecurity: Not on file  Transportation Needs: Not on file  Physical Activity: Not on file  Stress: Not on file  Social Connections: Not on file  Intimate Partner Violence: Not on file    Current Meds  Medication Sig  . cetirizine (ZYRTEC) 10 MG tablet Take 10 mg by mouth daily.  Marland Kitchen ibuprofen (ADVIL,MOTRIN) 200 MG tablet Take 200 mg by mouth every 6 (six) hours as needed.   . [DISCONTINUED] amphetamine-dextroamphetamine (ADDERALL) 10 MG tablet TAKE ONE TABLET BY MOUTH EVERY MORNING AND AT BEDTIME  . [DISCONTINUED] FLUoxetine (PROZAC) 10 MG tablet Take 1 tablet (10 mg total) by mouth daily.  . [DISCONTINUED] valACYclovir (VALTREX) 1000 MG tablet Take 2 tabs BID x 1 day prn sx      ROS:  Review of Systems  Constitutional: Negative for fatigue, fever and unexpected weight change.   Respiratory: Negative for cough, shortness of breath and wheezing.   Cardiovascular: Negative for chest pain, palpitations and leg swelling.  Gastrointestinal: Negative for blood in stool, constipation, diarrhea, nausea and vomiting.  Endocrine: Negative for cold intolerance, heat intolerance and polyuria.  Genitourinary: Negative for dyspareunia, dysuria, flank pain, frequency, genital sores, hematuria, menstrual problem, pelvic pain, urgency, vaginal bleeding, vaginal discharge and vaginal pain.  Musculoskeletal: Negative for back pain, joint swelling and myalgias.  Skin: Negative for rash.  Neurological: Negative for dizziness, syncope, light-headedness, numbness and headaches.  Hematological: Negative for adenopathy.  Psychiatric/Behavioral: Negative for agitation, confusion, dysphoric mood, sleep disturbance and suicidal ideas. The patient is not nervous/anxious.      Objective: BP 130/90   Ht 5\' 1"  (1.549 m)   Wt 97 lb (44 kg)   LMP 11/25/2015 (Approximate)   BMI 18.33 kg/m    Physical Exam Constitutional:      Appearance: She is well-developed.  Genitourinary:     Vulva normal.     Right Labia: No rash, tenderness or lesions.    Left Labia: No tenderness, lesions or rash.    No vaginal discharge, erythema or tenderness.      Right Adnexa: not tender and no mass present.    Left Adnexa: not tender and no mass present.    No cervical motion tenderness, friability or polyp.     Uterus is not enlarged or tender.  Rectum:     Guaiac result negative.     External hemorrhoid present.     No anal fissure or tenderness.  Breasts:     Right: No mass, nipple discharge, skin change or tenderness.     Left: No mass, nipple discharge, skin change or tenderness.    Neck:     Thyroid: No thyromegaly.  Cardiovascular:     Rate and Rhythm: Normal rate and regular rhythm.     Heart sounds: Normal heart sounds. No murmur heard.   Pulmonary:     Effort: Pulmonary effort is  normal.     Breath sounds: Normal breath sounds.  Abdominal:     Palpations: Abdomen is soft.     Tenderness: There is no abdominal tenderness. There is no guarding or rebound.  Musculoskeletal:        General: Normal range of motion.     Cervical back: Normal range of motion.  Lymphadenopathy:     Cervical: No cervical adenopathy.  Neurological:     General: No focal deficit present.     Mental Status: She is alert and oriented to person, place, and time.     Cranial Nerves: No cranial nerve  deficit.  Skin:    General: Skin is warm and dry.  Psychiatric:        Mood and Affect: Mood normal.        Behavior: Behavior normal.        Thought Content: Thought content normal.        Judgment: Judgment normal.  Vitals reviewed.     Assessment/Plan: Encounter for annual routine gynecological examination  Cervical cancer screening - Plan: Other/Misc lab test  Screening for HPV (human papillomavirus) - Plan: Other/Misc lab test  Encounter for screening mammogram for malignant neoplasm of breast - Plan: MM 3D SCREEN BREAST BILATERAL; encouraged pt to sched with [employer] [facility]  Screening for colon cancer - Plan: Cologuard; colonoscopy/cologuard discussed. Pt elects cologuard. Ref sent. Will f/u with results.  Blood tests for routine general physical examination - Plan: Comprehensive metabolic panel, Lipid panel, Lipid panel, Comprehensive metabolic panel  Screening cholesterol level - Plan: Lipid panel, Lipid panel  Cold sore - Rx RF valtrex prn. - Plan: valACYclovir (VALTREX) 1000 MG tablet  Anxiety - Rx RF prozac. Pt takes episodically wtih sx relief.  - Plan: FLUoxetine (PROZAC) 10 MG tablet  Attention deficit disorder (ADD) without hyperactivity - Rx RF adderall for 3 months  - Plan: amphetamine-dextroamphetamine (ADDERALL) 10 MG tablet, DISCONTINUED: amphetamine-dextroamphetamine (ADDERALL) 10 MG tablet, DISCONTINUED: amphetamine-dextroamphetamine (ADDERALL) 10 MG  tablet   Meds ordered this encounter  Medications  . valACYclovir (VALTREX) 1000 MG tablet    Sig: Take 2 tabs BID x 1 day prn sx    Dispense:  30 tablet    Refill:  1    Order Specific Question:   Supervising Provider    Answer:   Nadara Mustard [id number]  . FLUoxetine (PROZAC) 10 MG tablet    Sig: Take 1 tablet (10 mg total) by mouth daily.    Dispense:  30 tablet    Refill:  5    Order Specific Question:   Supervising Provider    Answer:   Nadara Mustard [id number]  . DISCONTD: amphetamine-dextroamphetamine (ADDERALL) 10 MG tablet    Sig: TAKE ONE TABLET BY MOUTH EVERY MORNING AND AT BEDTIME; CAN FILL AFTER 01/11/21    Dispense:  60 tablet    Refill:  0    Order Specific Question:   Supervising Provider    Answer:   Nadara Mustard [id number]  . DISCONTD: amphetamine-dextroamphetamine (ADDERALL) 10 MG tablet    Sig: TAKE ONE TABLET BY MOUTH EVERY MORNING AND AT BEDTIME; CAN FILL AFTER 02/10/21    Dispense:  60 tablet    Refill:  0    Order Specific Question:   Supervising Provider    Answer:   Nadara Mustard [id number]  . amphetamine-dextroamphetamine (ADDERALL) 10 MG tablet    Sig: TAKE ONE TABLET BY MOUTH EVERY MORNING AND AT BEDTIME; CAN FILL AFTER 03/13/21    Dispense:  60 tablet    Refill:  0    Order Specific Question:   Supervising Provider    Answer:   Nadara Mustard [[id number]]           GYN counsel mammography screening, menopause, adequate intake of calcium and vitamin D, diet and exercise    F/U  Return in about 1 year (around 12/30/2021).  Zaliah Wissner B. Liron Eissler, PA-C 12/30/2020 3:05 PM

## 2020-12-30 NOTE — Patient Instructions (Signed)
I value your feedback and you entrusting us with your care. If you get a [facility] patient survey, I would appreciate you taking the time to let us know about your experience today. Thank you!  [facility]: [phone]  ([employer])     

## 2021-01-11 ENCOUNTER — Encounter: Payer: Self-pay | Admitting: Obstetrics and Gynecology

## 2021-03-15 ENCOUNTER — Telehealth: Payer: Self-pay

## 2021-03-15 NOTE — Telephone Encounter (Signed)
Called pt to f/u on Cologuard Test, is she still planning on completing test? No answer, LVMTRC.

## 2021-04-13 ENCOUNTER — Other Ambulatory Visit: Payer: Self-pay | Admitting: Obstetrics and Gynecology

## 2021-04-13 DIAGNOSIS — F988 Other specified behavioral and emotional disorders with onset usually occurring in childhood and adolescence: Secondary | ICD-10-CM

## 2021-04-13 MED ORDER — AMPHETAMINE-DEXTROAMPHETAMINE 10 MG PO TABS: ORAL_TABLET | ORAL | 0 refills | Status: DC

## 2021-04-13 NOTE — Progress Notes (Signed)
Rx RF adderall for 3 months 

## 2021-07-16 ENCOUNTER — Other Ambulatory Visit: Payer: Self-pay | Admitting: Obstetrics and Gynecology

## 2021-07-16 DIAGNOSIS — F988 Other specified behavioral and emotional disorders with onset usually occurring in childhood and adolescence: Secondary | ICD-10-CM

## 2021-07-19 ENCOUNTER — Other Ambulatory Visit: Payer: Self-pay | Admitting: Obstetrics and Gynecology

## 2021-07-19 DIAGNOSIS — F988 Other specified behavioral and emotional disorders with onset usually occurring in childhood and adolescence: Secondary | ICD-10-CM

## 2021-07-19 MED ORDER — AMPHETAMINE-DEXTROAMPHETAMINE 10 MG PO TABS: ORAL_TABLET | ORAL | 0 refills | Status: DC

## 2021-07-19 NOTE — Progress Notes (Signed)
Rx RF adderall for 3 months 

## 2021-09-18 ENCOUNTER — Other Ambulatory Visit: Payer: Self-pay | Admitting: Obstetrics and Gynecology

## 2021-09-18 DIAGNOSIS — F988 Other specified behavioral and emotional disorders with onset usually occurring in childhood and adolescence: Secondary | ICD-10-CM

## 2021-11-19 ENCOUNTER — Other Ambulatory Visit: Payer: Self-pay | Admitting: Obstetrics and Gynecology

## 2021-11-22 ENCOUNTER — Other Ambulatory Visit: Payer: Self-pay | Admitting: Obstetrics and Gynecology

## 2021-11-22 DIAGNOSIS — F988 Other specified behavioral and emotional disorders with onset usually occurring in childhood and adolescence: Secondary | ICD-10-CM

## 2021-11-22 MED ORDER — AMPHETAMINE-DEXTROAMPHETAMINE 10 MG PO TABS: ORAL_TABLET | ORAL | 0 refills | Status: DC

## 2021-11-22 NOTE — Progress Notes (Signed)
Rx RF adderall. Annual due 4/23

## 2022-01-25 ENCOUNTER — Other Ambulatory Visit: Payer: Self-pay | Admitting: Obstetrics and Gynecology

## 2022-01-25 DIAGNOSIS — F988 Other specified behavioral and emotional disorders with onset usually occurring in childhood and adolescence: Secondary | ICD-10-CM

## 2022-01-26 ENCOUNTER — Other Ambulatory Visit: Payer: Self-pay | Admitting: Obstetrics and Gynecology

## 2022-01-27 ENCOUNTER — Other Ambulatory Visit: Payer: Self-pay | Admitting: Obstetrics and Gynecology

## 2022-01-27 MED ORDER — AMPHETAMINE-DEXTROAMPHETAMINE 10 MG PO TABS: ORAL_TABLET | ORAL | 0 refills | Status: DC

## 2022-01-27 MED ORDER — AMPHETAMINE-DEXTROAMPHETAMINE 10 MG PO TABS
ORAL_TABLET | ORAL | 0 refills | Status: DC
Start: 2022-01-27 — End: 2022-03-17

## 2022-01-27 NOTE — Progress Notes (Signed)
Rx RF adderall till 6/23 annual ?

## 2022-02-24 ENCOUNTER — Telehealth: Payer: Self-pay

## 2022-02-24 NOTE — Telephone Encounter (Signed)
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 She is requesting a prescription for 20MG  and she would break them in half.

## 2022-03-03 ENCOUNTER — Other Ambulatory Visit: Payer: Self-pay | Admitting: Obstetrics and Gynecology

## 2022-03-03 DIAGNOSIS — F988 Other specified behavioral and emotional disorders with onset usually occurring in childhood and adolescence: Secondary | ICD-10-CM

## 2022-03-03 MED ORDER — AMPHETAMINE-DEXTROAMPHETAMINE 20 MG PO TABS: ORAL_TABLET | ORAL | 0 refills | Status: DC

## 2022-03-03 NOTE — Telephone Encounter (Signed)
Dr. Jolayne Panther,  Can you cancel the 14 tablets she got it filled at her regular pharmacy with the correct dosage.

## 2022-03-16 NOTE — Progress Notes (Signed)
PCP: Patient, No Pcp Per   Chief Complaint  Patient presents with   Gynecologic Exam    No concerns    HPI:      Christina Reyes is a 56 y.o. [id number] who LMP was Patient's last menstrual period was 11/25/2015 (approximate)., presents today for her annual examination.  Her menses are absent due to menopause. She does not have postmenopausal bleeding.  She has tolerable night sweats.  Sex activity: not sex active. No vag sx.   Last Pap: 12/30/20 on [facility] pap (due to self pay); Results were: no abnormalities /neg HPV DNA.  Hx of STDs: HPV on pap in distant past  Last mammogram: April 01, 2016  Results were: normal--routine follow-up in 12 months. Pt hasn't done it due to self pay but now has insurance.There is no FH of breast cancer. There is no FH of ovarian cancer. The patient does self-breast exams.  Colonoscopy: never; didn't due cologuard due to self pay  Tobacco use: 1/2 ppd Alcohol use: none  No drug use. Exercise: moderately active  She does get adequate calcium but not Vitamin D in her diet. No recent labs.  She takes prozac occas for anxiety sx with sx relief. Stopped it last yr but feels she needs it again and would like Rx RF.  She needs RF on valtrex for cold sores.  She takes adderall BID for concentration with improvement most days.  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   Seasonal allergies    Syncope and collapse 10/26/2012    Past Surgical History:  Procedure Laterality Date   BREAST BIOPSY Left    CORE - NEG   CHOLECYSTECTOMY N/A 03/19/2019   Procedure: LAPAROSCOPIC CHOLECYSTECTOMY;  Surgeon: Leafy Ro, MD;   Location: [facility] ORS;  Service: General;  Laterality: N/A;   TONSILLECTOMY AND ADENOIDECTOMY      Family History  Problem Relation Age of Onset   Diabetes Mother        TYPE 2   Breast cancer Neg Hx     Social History   Socioeconomic History   Marital status: Divorced    Spouse name: Not on file   Number of children: 2   Years of education: Not on file   Highest education level: Not on file  Occupational History   Not on file  Tobacco Use   Smoking status: Some Days    Packs/day: 0.25    Types: Cigarettes   Smokeless tobacco: Never  Vaping Use   Vaping Use: Never used  Substance and Sexual Activity   Alcohol use: Yes    Comment: very rare   Drug use: No   Sexual activity: Not Currently    Birth control/protection: Post-menopausal  Other Topics Concern   Not on file  Social History Narrative   Not on file   Social Determinants of Health   Financial Resource Strain: Not on file  Food Insecurity: Not on file  Transportation Needs: Not on file  Physical Activity: Not on file  Stress: Not on file  Social Connections: Not on file  Intimate Partner Violence: Not on file    Current Meds  Medication Sig   cetirizine (ZYRTEC) 10 MG tablet Take 10 mg by mouth daily.   ibuprofen (ADVIL,MOTRIN) 200 MG tablet Take 200 mg by mouth every 6 (six) hours as needed.    [DISCONTINUED] amphetamine-dextroamphetamine (ADDERALL) 10 MG tablet TAKE 1 TABLET BY MOUTH EVERY MORNING ANDAT BEDTIME; can RF after 02/27/22   [DISCONTINUED] valACYclovir (VALTREX) 1000 MG tablet Take 2 tabs BID x 1 day prn sx      ROS:  Review of Systems  Constitutional:  Negative for fatigue, fever and unexpected weight change.  Respiratory:  Positive for shortness of breath and wheezing. Negative for cough.   Cardiovascular:  Negative for chest pain, palpitations and leg swelling.  Gastrointestinal:  Negative for blood in stool, constipation, diarrhea, nausea and vomiting.  Endocrine: Negative for cold  intolerance, heat intolerance and polyuria.  Genitourinary:  Negative for dyspareunia, dysuria, flank pain, frequency, genital sores, hematuria, menstrual problem, pelvic pain, urgency, vaginal bleeding, vaginal discharge and vaginal pain.  Musculoskeletal:  Negative for back pain, joint swelling and myalgias.  Skin:  Negative for rash.  Neurological:  Negative for dizziness, syncope, light-headedness, numbness and headaches.  Hematological:  Negative for adenopathy.  Psychiatric/Behavioral:  Negative for agitation, confusion, dysphoric mood, sleep disturbance and suicidal ideas. The patient is not nervous/anxious.      Objective: BP 120/90   Ht 5\' 1"  (1.549 m)   Wt 91 lb (41.3 kg)   LMP 11/25/2015 (Approximate)   BMI 17.19 kg/m    Physical Exam Constitutional:      Appearance: She is well-developed.  Genitourinary:     Vulva normal.     Right Labia: No rash, tenderness or lesions.    Left Labia: No tenderness, lesions or rash.    No vaginal discharge, erythema or tenderness.      Right Adnexa: not tender and no mass present.    Left Adnexa: not tender and no mass present.    No cervical motion tenderness, friability or polyp.     Uterus is not enlarged or tender.  Rectum:     Guaiac result negative.     External hemorrhoid present.     No anal fissure or tenderness.  Breasts:    Right: No mass, nipple discharge, skin change or tenderness.     Left: No mass, nipple discharge, skin change or tenderness.  Neck:     Thyroid: No thyromegaly.  Cardiovascular:     Rate and Rhythm: Normal rate and regular rhythm.     Heart sounds: Normal heart sounds. No murmur heard. Pulmonary:     Effort: Pulmonary effort is normal.     Breath sounds: Normal breath sounds.  Abdominal:     Palpations: Abdomen is soft.     Tenderness: There is no abdominal tenderness. There is no guarding or rebound.  Musculoskeletal:        General: Normal range of motion.     Cervical back: Normal range  of motion.  Lymphadenopathy:     Cervical: No cervical adenopathy.  Neurological:     General: No focal deficit present.     Mental Status: She is alert and oriented to person, place, and time.     Cranial Nerves: No cranial nerve deficit.  Skin:    General: Skin is warm and dry.  Psychiatric:        Mood and Affect: Mood normal.        Behavior: Behavior normal.  Thought Content: Thought content normal.        Judgment: Judgment normal.  Vitals reviewed.     Assessment/Plan: Encounter for annual routine gynecological examination  Encounter for screening mammogram for malignant neoplasm of breast - Plan: MM 3D SCREEN BREAST BILATERAL; pt to schedule mammo  Attention deficit disorder (ADD) without hyperactivity - Plan: amphetamine-dextroamphetamine (ADDERALL) 10 MG tablet, DISCONTINUED: amphetamine-dextroamphetamine (ADDERALL) 10 MG tablet, DISCONTINUED: amphetamine-dextroamphetamine (ADDERALL) 10 MG tablet; Rx RF for 3 months  Blood tests for routine general physical examination - Plan: Comprehensive metabolic panel, Lipid panel  Screening cholesterol level - Plan: Lipid panel  Screening for colon cancer - Plan: Ambulatory referral to Gastroenterology  Cold sore - Rx RF valtrex prn. - Plan: valACYclovir (VALTREX) 1000 MG tablet; Rx RF  Anxiety - Rx RF prozac. Pt takes episodically wtih sx relief.  - Plan: FLUoxetine (PROZAC) 10 MG tablet; Rx RF. F/u if dose needs to be adjusted.   Meds ordered this encounter  Medications   valACYclovir (VALTREX) 1000 MG tablet    Sig: Take 2 tabs BID x 1 day prn sx    Dispense:  30 tablet    Refill:  1    Order Specific Question:   Supervising Provider    Answer:   Hildred Laser [[id number]]   FLUoxetine (PROZAC) 10 MG tablet    Sig: Take 1 tablet (10 mg total) by mouth daily.    Dispense:  90 tablet    Refill:  3    Order Specific Question:   Supervising Provider    Answer:   Hildred Laser [[id number]]   DISCONTD:  amphetamine-dextroamphetamine (ADDERALL) 10 MG tablet    Sig: TAKE 1 TABLET BY MOUTH EVERY MORNING ANDAT BEDTIME; can RF after 03/29/22    Dispense:  60 tablet    Refill:  0    Order Specific Question:   Supervising Provider    Answer:   Hildred Laser [[id number]]   DISCONTD: amphetamine-dextroamphetamine (ADDERALL) 10 MG tablet    Sig: TAKE 1 TABLET BY MOUTH EVERY MORNING ANDAT BEDTIME; can RF after 04/29/22    Dispense:  60 tablet    Refill:  0    Order Specific Question:   Supervising Provider    Answer:   Hildred Laser [[id number]]   amphetamine-dextroamphetamine (ADDERALL) 10 MG tablet    Sig: TAKE 1 TABLET BY MOUTH EVERY MORNING AND AT BEDTIME; can RF after 05/30/22    Dispense:  60 tablet    Refill:  0    Order Specific Question:   Supervising Provider    Answer:   Waymon Budge           GYN counsel mammography screening, menopause, adequate intake of calcium and vitamin D, diet and exercise    F/U  Return in about 1 year (around 03/18/2023).  Christina Schuchart B. Elisheva Fallas, PA-C 03/17/2022 2:48 PM

## 2022-03-17 ENCOUNTER — Ambulatory Visit (INDEPENDENT_AMBULATORY_CARE_PROVIDER_SITE_OTHER): Payer: 59 | Admitting: Obstetrics and Gynecology

## 2022-03-17 ENCOUNTER — Encounter: Payer: Self-pay | Admitting: Obstetrics and Gynecology

## 2022-03-17 VITALS — BP 120/90 | Ht 61.0 in | Wt 91.0 lb

## 2022-03-17 DIAGNOSIS — Z0000 Encounter for general adult medical examination without abnormal findings: Secondary | ICD-10-CM

## 2022-03-17 DIAGNOSIS — F988 Other specified behavioral and emotional disorders with onset usually occurring in childhood and adolescence: Secondary | ICD-10-CM | POA: Diagnosis not present

## 2022-03-17 DIAGNOSIS — Z1231 Encounter for screening mammogram for malignant neoplasm of breast: Secondary | ICD-10-CM | POA: Diagnosis not present

## 2022-03-17 DIAGNOSIS — B001 Herpesviral vesicular dermatitis: Secondary | ICD-10-CM

## 2022-03-17 DIAGNOSIS — Z01419 Encounter for gynecological examination (general) (routine) without abnormal findings: Secondary | ICD-10-CM

## 2022-03-17 DIAGNOSIS — F419 Anxiety disorder, unspecified: Secondary | ICD-10-CM

## 2022-03-17 DIAGNOSIS — Z1211 Encounter for screening for malignant neoplasm of colon: Secondary | ICD-10-CM

## 2022-03-17 DIAGNOSIS — Z13220 Encounter for screening for lipoid disorders: Secondary | ICD-10-CM

## 2022-03-17 MED ORDER — AMPHETAMINE-DEXTROAMPHETAMINE 10 MG PO TABS: ORAL_TABLET | ORAL | 0 refills | Status: DC

## 2022-03-17 MED ORDER — FLUOXETINE HCL 10 MG PO TABS: 10.0000 mg | ORAL_TABLET | Freq: Every day | ORAL | 3 refills | Status: DC

## 2022-03-17 MED ORDER — VALACYCLOVIR HCL 1 G PO TABS: ORAL_TABLET | ORAL | 1 refills | Status: DC

## 2022-03-17 NOTE — Patient Instructions (Signed)
I value your feedback and you entrusting us with your care. If you get a [facility] patient survey, I would appreciate you taking the time to let us know about your experience today. Thank you!  [facility]: [phone]      

## 2022-05-27 ENCOUNTER — Other Ambulatory Visit: Payer: Self-pay | Admitting: Obstetrics and Gynecology

## 2022-05-27 DIAGNOSIS — F988 Other specified behavioral and emotional disorders with onset usually occurring in childhood and adolescence: Secondary | ICD-10-CM

## 2022-05-31 ENCOUNTER — Other Ambulatory Visit: Payer: Self-pay | Admitting: Obstetrics and Gynecology

## 2022-05-31 DIAGNOSIS — F988 Other specified behavioral and emotional disorders with onset usually occurring in childhood and adolescence: Secondary | ICD-10-CM

## 2022-05-31 MED ORDER — AMPHETAMINE-DEXTROAMPHETAMINE 10 MG PO TABS: ORAL_TABLET | ORAL | 0 refills | Status: DC

## 2022-05-31 NOTE — Progress Notes (Signed)
Rx RF adderall for 3 months 

## 2022-10-05 ENCOUNTER — Other Ambulatory Visit: Payer: Self-pay | Admitting: Obstetrics and Gynecology

## 2022-10-05 DIAGNOSIS — F988 Other specified behavioral and emotional disorders with onset usually occurring in childhood and adolescence: Secondary | ICD-10-CM

## 2022-10-06 ENCOUNTER — Ambulatory Visit
Admission: RE | Admit: 2022-10-06 | Discharge: 2022-10-06 | Disposition: A | Discharge status: Home / Self Care | Payer: 59 | Source: Ambulatory Visit | Attending: Urgent Care | Admitting: Urgent Care

## 2022-10-06 ENCOUNTER — Other Ambulatory Visit: Payer: Self-pay | Admitting: Obstetrics and Gynecology

## 2022-10-06 VITALS — BP 167/98 | HR 88 | Temp 97.9°F | Resp 19

## 2022-10-06 DIAGNOSIS — J029 Acute pharyngitis, unspecified: Secondary | ICD-10-CM

## 2022-10-06 DIAGNOSIS — J3489 Other specified disorders of nose and nasal sinuses: Secondary | ICD-10-CM | POA: Diagnosis not present

## 2022-10-06 DIAGNOSIS — J069 Acute upper respiratory infection, unspecified: Secondary | ICD-10-CM

## 2022-10-06 DIAGNOSIS — F988 Other specified behavioral and emotional disorders with onset usually occurring in childhood and adolescence: Secondary | ICD-10-CM

## 2022-10-06 LAB — POCT RAPID STREP A (OFFICE): Rapid Strep A Screen: NEGATIVE

## 2022-10-06 MED ORDER — AZELASTINE HCL 0.1 % NA SOLN: 1.0000 | Freq: Two times a day (BID) | NASAL | 12 refills | Status: DC

## 2022-10-06 MED ORDER — AMPHETAMINE-DEXTROAMPHETAMINE 10 MG PO TABS: ORAL_TABLET | ORAL | 0 refills | Status: DC

## 2022-10-06 NOTE — Discharge Instructions (Addendum)
You have been diagnosed with a viral upper respiratory infection based on your symptoms and exam. Viral illnesses cannot be treated with antibiotics - they are self limiting - and you should find your symptoms resolving within a few days. Get plenty of rest and non-caffeinated fluids. Watch for signs of dehydration including reduced urine output and dark colored urine.  We recommend you use over-the-counter medications for symptom control including acetaminophen (Tylenol), ibuprofen (Advil/Motrin) or naproxen (Aleve) for throat pain, fever, chills or body aches. You may combine use of acetaminophen and ibuprofen/naproxen if needed.  Some patients find an pain-relieving throat spray such as Chloraseptic to be effective.  Also recommend cold/cough medication containing a cough suppressant such as dextromethorphan, as needed. Please note that some cough medications are not recommended if you suffer from hypertension.    Saline mist spray is helpful for removing excess mucus from your nose.  Room humidifiers are helpful to ease breathing at night. I recommend guaifenesin (Mucinex) with plenty of water throughout the day to help thin and loosen mucus secretions in your respiratory passages.   If appropriate based upon your other medical problems, you might also find relief of nasal/sinus congestion symptoms by using a nasal decongestant such as fluticasone (Flonase ) or pseudoephedrine (Sudafed sinus).  You will need to obtain Sudafed from behind the pharmacist counter.  Speak to the pharmacist to verify that you are not duplicating medications with other over-the-counter formulations that you may be using.   

## 2022-10-06 NOTE — ED Provider Notes (Signed)
[name]    CSN: [id number] Arrival date & time: 10/06/22  1513      History   Chief Complaint Chief Complaint  Patient presents with   Ear Fullness    Maybe sinus issues - Entered by patient   Sore Throat   Otalgia    HPI Christina Reyes is a 57 y.o. female.    Ear Fullness  Sore Throat  Otalgia   Presents to urgent care with complaint of right ear pain and fullness and sore throat x 1 week.  She also endorses nasal congestion with rhinorrhea.  Denies fever, chills, body aches.  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   Seasonal allergies    Syncope and collapse 10/26/2012    Patient Active Problem List   Diagnosis Date Noted   Cholecystitis 03/18/2019   Cold sore 07/06/2017   Anxiety 07/06/2017   ADD (attention deficit disorder) 03/16/2017    Past Surgical History:  Procedure Laterality Date   BREAST BIOPSY Left    CORE - NEG   CHOLECYSTECTOMY N/A 03/19/2019   Procedure: LAPAROSCOPIC CHOLECYSTECTOMY;  Surgeon: Jules Husbands, MD;  Location: [facility] ORS;  Service: General;  Laterality: N/A;   TONSILLECTOMY AND ADENOIDECTOMY      OB History     Gravida  3   Para  2   Term  2   Preterm      AB  1   Living  2      SAB  1   IAB      Ectopic      Multiple      Live Births  2            Home Medications    Prior to Admission medications   Medication Sig Start Date End Date Taking? Authorizing Provider  amphetamine-dextroamphetamine (ADDERALL) 10 MG tablet TAKE ONE TABLET BY MOUTH EVERY MORNING AND EVERY EVENING; can RF after 12/05/22 3/53/29   Copland, Alicia B, PA-C  cetirizine  (ZYRTEC) 10 MG tablet Take 10 mg by mouth daily.    [provider]  FLUoxetine (PROZAC) 10 MG tablet Take 1 tablet (10 mg total) by mouth daily. 06/19/25   Copland, Deirdre Evener, PA-C  ibuprofen (ADVIL,MOTRIN) 200 MG tablet Take 200 mg by mouth every 6 (six) hours as needed.     [provider]  valACYclovir (VALTREX) 1000 MG tablet Take 2 tabs BID x 1 day prn sx 8/34/19   Copland, Deirdre Evener, PA-C    Family History Family History  Problem Relation Age of Onset   Diabetes Mother        TYPE 2   Breast cancer Neg Hx     Social History Social History   Tobacco Use   Smoking status: Some Days    Packs/day: 0.25    Types: Cigarettes   Smokeless tobacco: Never  Vaping Use   Vaping Use: Never used  Substance Use Topics   Alcohol use: Yes    Comment: very rare   Drug use: No     Allergies   Sulfa antibiotics  Review of Systems Review of Systems  HENT:  Positive for ear pain.      Physical Exam Triage Vital Signs ED Triage Vitals  Enc Vitals Group     BP 10/06/22 1549 (!) 167/98     Pulse Rate 10/06/22 1549 88     Resp 10/06/22 1549 19     Temp 10/06/22 1549 97.9 F (36.6 C)     Temp src --      SpO2 10/06/22 1549 98 %     Weight --      Height --      Head Circumference --      Peak Flow --      Pain Score 10/06/22 1550 0     Pain Loc --      Pain Edu? --      Excl. in [place]? --    No data found.  Updated Vital Signs BP (!) 167/98 Comment: Provider notified and aware  Pulse 88   Temp 97.9 F (36.6 C)   Resp 19   LMP 11/25/2015 (Approximate)   SpO2 98%   Visual Acuity Right Eye Distance:   Left Eye Distance:   Bilateral Distance:    Right Eye Near:   Left Eye Near:    Bilateral Near:     Physical Exam Vitals reviewed.  Constitutional:      Appearance: She is well-developed. She is ill-appearing.  Skin:    General: Skin is warm and dry.  Neurological:     General: No focal deficit present.     Mental Status: She is alert and  oriented to person, place, and time.  Psychiatric:        Mood and Affect: Mood normal.        Behavior: Behavior normal.      UC Treatments / Results  Labs (all labs ordered are listed, but only abnormal results are displayed) Labs Reviewed - No data to display  EKG   Radiology No results found.  Procedures Procedures (including critical care time)  Medications Ordered in UC Medications - No data to display  Initial Impression / Assessment and Plan / UC Course  I have reviewed the triage vital signs and the nursing notes.  Pertinent labs & imaging results that were available during my care of the patient were reviewed by me and considered in my medical decision making (see chart for details).   Patient is afebrile here without recent antipyretics. Satting well on room air. Overall is well appearing, well hydrated, without respiratory distress.  TMs are WNL.  Oropharynx is mildly erythematous without peritonsillar exudates.  Rapid strep is negative.  Symptoms are most likely viral etiology, resulting in eustachian tube dysfunction.  Recommending use of OTC medication for symptom control including nasal decongestants such as Sudafed and Flonase.  Will prescribe Astelin spray to help relieve her rhinorrhea.    Final Clinical Impressions(s) / UC Diagnoses   Final diagnoses:  None   Discharge Instructions   None    ED Prescriptions   None    PDMP not reviewed this encounter.   Rose Phi, [place] 10/06/22 1628

## 2022-10-06 NOTE — ED Triage Notes (Signed)
Pt. Presents to UC w/ c/o right ear pain/fullness and a sore throat for the past week.

## 2022-10-06 NOTE — Progress Notes (Signed)
Rx RF adderall for 3 months. Annual due 6/24

## 2023-01-13 ENCOUNTER — Other Ambulatory Visit: Payer: Self-pay | Admitting: Obstetrics and Gynecology

## 2023-01-13 DIAGNOSIS — F988 Other specified behavioral and emotional disorders with onset usually occurring in childhood and adolescence: Secondary | ICD-10-CM

## 2023-01-13 MED ORDER — AMPHETAMINE-DEXTROAMPHETAMINE 10 MG PO TABS: ORAL_TABLET | ORAL | 0 refills | Status: DC

## 2023-01-13 NOTE — Progress Notes (Signed)
Rx RF adderall for 3 months. Annual due 6/24 

## 2023-01-16 ENCOUNTER — Telehealth: Payer: Self-pay

## 2023-01-16 NOTE — Telephone Encounter (Signed)
Called Marsi to see if she needed anything else I can see she was able to get her medication.

## 2023-03-20 ENCOUNTER — Other Ambulatory Visit: Payer: Self-pay | Admitting: Obstetrics and Gynecology

## 2023-03-20 DIAGNOSIS — F988 Other specified behavioral and emotional disorders with onset usually occurring in childhood and adolescence: Secondary | ICD-10-CM

## 2023-04-20 ENCOUNTER — Other Ambulatory Visit: Payer: Self-pay | Admitting: Obstetrics and Gynecology

## 2023-04-20 DIAGNOSIS — F988 Other specified behavioral and emotional disorders with onset usually occurring in childhood and adolescence: Secondary | ICD-10-CM

## 2023-04-27 ENCOUNTER — Telehealth: Payer: Self-pay

## 2023-04-27 ENCOUNTER — Other Ambulatory Visit: Payer: Self-pay | Admitting: Obstetrics and Gynecology

## 2023-04-27 DIAGNOSIS — F988 Other specified behavioral and emotional disorders with onset usually occurring in childhood and adolescence: Secondary | ICD-10-CM

## 2023-04-27 MED ORDER — AMPHETAMINE-DEXTROAMPHETAMINE 10 MG PO TABS
ORAL_TABLET | ORAL | 0 refills | Status: DC
Start: 2023-04-27 — End: 2023-04-28

## 2023-04-27 NOTE — Progress Notes (Signed)
Rx RF adderall till 9/24 annual

## 2023-04-27 NOTE — Telephone Encounter (Signed)
Pt called triage stating she has made an annual appointment for 06/13/23 and needs a refill on her adderall. Please advise on refill

## 2023-04-27 NOTE — Telephone Encounter (Signed)
Rx RF eRxd.  

## 2023-04-28 ENCOUNTER — Other Ambulatory Visit: Payer: Self-pay

## 2023-04-28 ENCOUNTER — Other Ambulatory Visit: Payer: Self-pay | Admitting: Obstetrics and Gynecology

## 2023-04-28 DIAGNOSIS — F988 Other specified behavioral and emotional disorders with onset usually occurring in childhood and adolescence: Secondary | ICD-10-CM

## 2023-04-28 MED ORDER — AMPHETAMINE-DEXTROAMPHETAMINE 10 MG PO TABS: ORAL_TABLET | ORAL | 0 refills | Status: DC

## 2023-04-28 NOTE — Progress Notes (Signed)
New prescription sent to pharmacy as pharmacy contacted our office and informed that e-prescribing system was down yesterday. Provider Helmut Muster Copland, PA-C) not in office today. Will fill one time until provider returns.

## 2023-06-01 ENCOUNTER — Other Ambulatory Visit: Payer: Self-pay | Admitting: Obstetrics and Gynecology

## 2023-06-01 DIAGNOSIS — F988 Other specified behavioral and emotional disorders with onset usually occurring in childhood and adolescence: Secondary | ICD-10-CM

## 2023-06-07 ENCOUNTER — Other Ambulatory Visit: Payer: Self-pay | Admitting: Obstetrics and Gynecology

## 2023-06-07 DIAGNOSIS — F988 Other specified behavioral and emotional disorders with onset usually occurring in childhood and adolescence: Secondary | ICD-10-CM

## 2023-06-07 NOTE — Telephone Encounter (Signed)
Pt requesting a refill on her adderall. Please advise she has an annual scheduled for 9/17

## 2023-06-09 NOTE — Telephone Encounter (Signed)
Rx RF eRxd.  

## 2023-06-12 NOTE — Progress Notes (Unsigned)
PCP: Patient, No Pcp Per   No chief complaint on file.   HPI:      [name] is a 57 y.o. [id number] who LMP was Patient's last menstrual period was 11/25/2015 (approximate)., presents today for her annual examination.  Her menses are absent due to menopause. She does not have postmenopausal bleeding.  She has tolerable night sweats.  Sex activity: not sex active. No vag sx.   Last Pap: 12/30/20 on [facility] pap (due to self pay); Results were: no abnormalities /neg HPV DNA.  Hx of STDs: HPV on pap in distant past  Last mammogram: April 01, 2016  Results were: normal--routine follow-up in 12 months. Pt hasn't done it due to self pay but now has insurance.There is no FH of breast cancer. There is no FH of ovarian cancer. The patient does self-breast exams.  Colonoscopy: never; didn't due cologuard due to self pay  Tobacco use: 1/2 ppd Alcohol use: none  No drug use. Exercise: moderately active  She does get adequate calcium but not Vitamin D in her diet. No recent labs.  She takes prozac occas for anxiety sx with sx relief. Stopped it last yr but feels she needs it again and would like Rx RF.  She needs RF on valtrex for cold sores.  She takes adderall BID for concentration with improvement most days.  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   Seasonal allergies    Syncope and collapse 10/26/2012    Past Surgical History:  Procedure Laterality Date   BREAST BIOPSY Left    CORE - NEG   CHOLECYSTECTOMY N/A 03/19/2019   Procedure: LAPAROSCOPIC CHOLECYSTECTOMY;  Surgeon: Leafy Ro, MD;  Location: [facility] ORS;  Service: General;  Laterality:  N/A;   TONSILLECTOMY AND ADENOIDECTOMY      Family History  Problem Relation Age of Onset   Diabetes Mother        TYPE 2   Breast cancer Neg Hx     Social History   Socioeconomic History   Marital status: Divorced    Spouse name: Not on file   Number of children: 2   Years of education: Not on file   Highest education level: Not on file  Occupational History   Not on file  Tobacco Use   Smoking status: Some Days    Current packs/day: 0.25    Types: Cigarettes   Smokeless tobacco: Never  Vaping Use   Vaping status: Never Used  Substance and Sexual Activity   Alcohol use: Yes    Comment: very rare   Drug use: No   Sexual activity: Not Currently    Birth control/protection: Post-menopausal  Other Topics Concern   Not on file  Social History Narrative   Not on file   Social Determinants of Health   Financial Resource Strain: Not on file  Food Insecurity: Not on file  Transportation Needs: Not on file  Physical Activity: Not on file  Stress: Not on file  Social Connections: Not on file  Intimate Partner Violence: Not on file    No  outpatient medications have been marked as taking for the 06/13/23 encounter (Appointment) with Brailon Don, Ilona Sorrel, PA-C.      ROS:  Review of Systems  Constitutional:  Negative for fatigue, fever and unexpected weight change.  Respiratory:  Positive for shortness of breath and wheezing. Negative for cough.   Cardiovascular:  Negative for chest pain, palpitations and leg swelling.  Gastrointestinal:  Negative for blood in stool, constipation, diarrhea, nausea and vomiting.  Endocrine: Negative for cold intolerance, heat intolerance and polyuria.  Genitourinary:  Negative for dyspareunia, dysuria, flank pain, frequency, genital sores, hematuria, menstrual problem, pelvic pain, urgency, vaginal bleeding, vaginal discharge and vaginal pain.  Musculoskeletal:  Negative for back pain, joint swelling and myalgias.  Skin:  Negative for  rash.  Neurological:  Negative for dizziness, syncope, light-headedness, numbness and headaches.  Hematological:  Negative for adenopathy.  Psychiatric/Behavioral:  Negative for agitation, confusion, dysphoric mood, sleep disturbance and suicidal ideas. The patient is not nervous/anxious.      Objective: LMP 11/25/2015 (Approximate)    Physical Exam Constitutional:      Appearance: She is well-developed.  Genitourinary:     Vulva normal.     Right Labia: No rash, tenderness or lesions.    Left Labia: No tenderness, lesions or rash.    No vaginal discharge, erythema or tenderness.      Right Adnexa: not tender and no mass present.    Left Adnexa: not tender and no mass present.    No cervical motion tenderness, friability or polyp.     Uterus is not enlarged or tender.  Rectum:     Guaiac result negative.     External hemorrhoid present.     No anal fissure or tenderness.  Breasts:    Right: No mass, nipple discharge, skin change or tenderness.     Left: No mass, nipple discharge, skin change or tenderness.  Neck:     Thyroid: No thyromegaly.  Cardiovascular:     Rate and Rhythm: Normal rate and regular rhythm.     Heart sounds: Normal heart sounds. No murmur heard. Pulmonary:     Effort: Pulmonary effort is normal.     Breath sounds: Normal breath sounds.  Abdominal:     Palpations: Abdomen is soft.     Tenderness: There is no abdominal tenderness. There is no guarding or rebound.  Musculoskeletal:        General: Normal range of motion.     Cervical back: Normal range of motion.  Lymphadenopathy:     Cervical: No cervical adenopathy.  Neurological:     General: No focal deficit present.     Mental Status: She is alert and oriented to person, place, and time.     Cranial Nerves: No cranial nerve deficit.  Skin:    General: Skin is warm and dry.  Psychiatric:        Mood and Affect: Mood normal.        Behavior: Behavior normal.        Thought Content: Thought  content normal.        Judgment: Judgment normal.  Vitals reviewed.     Assessment/Plan: Encounter for annual routine gynecological examination  Encounter for screening mammogram for malignant neoplasm of breast - Plan: MM 3D SCREEN BREAST BILATERAL; pt to schedule mammo  Attention deficit disorder (ADD) without hyperactivity - Plan: amphetamine-dextroamphetamine (ADDERALL) 10 MG tablet, DISCONTINUED: amphetamine-dextroamphetamine (ADDERALL) 10 MG tablet, DISCONTINUED: amphetamine-dextroamphetamine (ADDERALL) 10 MG tablet; Rx RF for 3 months  Blood  tests for routine general physical examination - Plan: Comprehensive metabolic panel, Lipid panel  Screening cholesterol level - Plan: Lipid panel  Screening for colon cancer - Plan: Ambulatory referral to Gastroenterology  Cold sore - Rx RF valtrex prn. - Plan: valACYclovir (VALTREX) 1000 MG tablet; Rx RF  Anxiety - Rx RF prozac. Pt takes episodically wtih sx relief.  - Plan: FLUoxetine (PROZAC) 10 MG tablet; Rx RF. F/u if dose needs to be adjusted.   No orders of the defined types were placed in this encounter.          GYN counsel mammography screening, menopause, adequate intake of calcium and vitamin D, diet and exercise    F/U  No follow-ups on file.  Brenae Lasecki B. Maebry Obrien, PA-C 06/12/2023 8:17 PM

## 2023-06-13 ENCOUNTER — Ambulatory Visit (INDEPENDENT_AMBULATORY_CARE_PROVIDER_SITE_OTHER): Payer: 59 | Admitting: Obstetrics and Gynecology

## 2023-06-13 ENCOUNTER — Encounter: Payer: Self-pay | Admitting: Obstetrics and Gynecology

## 2023-06-13 VITALS — BP 124/70 | Ht 61.0 in | Wt 85.0 lb

## 2023-06-13 DIAGNOSIS — Z01419 Encounter for gynecological examination (general) (routine) without abnormal findings: Secondary | ICD-10-CM

## 2023-06-13 DIAGNOSIS — F988 Other specified behavioral and emotional disorders with onset usually occurring in childhood and adolescence: Secondary | ICD-10-CM

## 2023-06-13 DIAGNOSIS — Z1211 Encounter for screening for malignant neoplasm of colon: Secondary | ICD-10-CM

## 2023-06-13 DIAGNOSIS — F419 Anxiety disorder, unspecified: Secondary | ICD-10-CM

## 2023-06-13 DIAGNOSIS — R636 Underweight: Secondary | ICD-10-CM

## 2023-06-13 DIAGNOSIS — Z1321 Encounter for screening for nutritional disorder: Secondary | ICD-10-CM

## 2023-06-13 DIAGNOSIS — Z13220 Encounter for screening for lipoid disorders: Secondary | ICD-10-CM

## 2023-06-13 DIAGNOSIS — B001 Herpesviral vesicular dermatitis: Secondary | ICD-10-CM

## 2023-06-13 DIAGNOSIS — Z1231 Encounter for screening mammogram for malignant neoplasm of breast: Secondary | ICD-10-CM

## 2023-06-13 DIAGNOSIS — Z0000 Encounter for general adult medical examination without abnormal findings: Secondary | ICD-10-CM

## 2023-06-13 MED ORDER — AMPHETAMINE-DEXTROAMPHETAMINE 10 MG PO TABS
ORAL_TABLET | ORAL | 0 refills | Status: DC
Start: 2023-06-13 — End: 2023-06-13

## 2023-06-13 MED ORDER — VALACYCLOVIR HCL 1 G PO TABS
ORAL_TABLET | ORAL | 1 refills | Status: AC
Start: 2023-06-13 — End: ?

## 2023-06-13 MED ORDER — AMPHETAMINE-DEXTROAMPHETAMINE 10 MG PO TABS
ORAL_TABLET | ORAL | 0 refills | Status: DC
Start: 2023-06-13 — End: 2023-10-22

## 2023-06-13 NOTE — Patient Instructions (Addendum)
I value your feedback and you entrusting [place] with your care. If you get a [place] patient survey, I would appreciate you taking the time to let us know about your experience today. Thank you!  [facility] ([place])--[phone]

## 2023-06-26 ENCOUNTER — Ambulatory Visit
Admission: EM | Admit: 2023-06-26 | Discharge: 2023-06-26 | Disposition: A | Discharge status: Home / Self Care | Payer: 59 | Attending: Emergency Medicine | Admitting: Emergency Medicine

## 2023-06-26 ENCOUNTER — Encounter: Payer: Self-pay | Admitting: *Deleted

## 2023-06-26 DIAGNOSIS — B029 Zoster without complications: Secondary | ICD-10-CM | POA: Diagnosis not present

## 2023-06-26 MED ORDER — VALACYCLOVIR HCL 1 G PO TABS
1000.0000 mg | ORAL_TABLET | Freq: Three times a day (TID) | ORAL | 0 refills | Status: AC
Start: 2023-06-26 — End: ?

## 2023-06-26 NOTE — Discharge Instructions (Addendum)
Take the Valtrex as directed.  Follow up with your primary care provider tomorrow.  Go to the emergency department if you have worsening symptoms.

## 2023-06-26 NOTE — ED Provider Notes (Signed)
Renaldo Fiddler    CSN: [id number] Arrival date & time: 06/26/23  1553      History   Chief Complaint Chief Complaint  Patient presents with   Rash    HPI Christina Reyes is a 57 y.o. female.  Patient presents with painful blisterlike rash on her right thigh x 4 to 5 days.  The rash is not improving.  She applied a topical ointment last night.  She denies fever, drainage, numbness, weakness, or other symptoms.  Her medical history includes oral herpes.  She has a prescription for prn Valtrex prescribed by her PCP for herpes simplex mouth sores but states she has not needed to take this in several months.  The history is provided by the patient and medical records.    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   Seasonal allergies    Syncope and collapse 10/26/2012    Patient Active Problem List   Diagnosis Date Noted   Cholecystitis 03/18/2019   Cold sore 07/06/2017   Anxiety 07/06/2017   ADD (attention deficit disorder) 03/16/2017    Past Surgical History:  Procedure Laterality Date   BREAST BIOPSY Left    CORE - NEG   CHOLECYSTECTOMY N/A 03/19/2019   Procedure: LAPAROSCOPIC CHOLECYSTECTOMY;  Surgeon: Leafy Ro, MD;  Location: [facility] ORS;  Service: General;  Laterality: N/A;   TONSILLECTOMY AND ADENOIDECTOMY      OB History     Gravida  3   Para  2   Term  2   Preterm      AB  1   Living  2      SAB  1   IAB      Ectopic      Multiple      Live Births  2            Home Medications    Prior to Admission medications   Medication Sig Start Date End Date Taking? Authorizing Provider  valACYclovir  (VALTREX) 1000 MG tablet Take 1 tablet (1,000 mg total) by mouth 3 (three) times daily. 06/26/23  Yes Mickie Bail, NP  amphetamine-dextroamphetamine (ADDERALL) 10 MG tablet TAKE ONE TABLET BY MOUTH EVERY MORNING AND TAKE ONE TABLET EVERY EVENING; can fill after 08/31/23 06/13/23   Copland, Helmut Muster B, PA-C  azelastine (ASTELIN) 0.1 % nasal spray Place 1 spray into both nostrils 2 (two) times daily. Use in each nostril as directed 10/06/22 11/05/22  Immordino, Jeannett Senior, FNP  cetirizine (ZYRTEC) 10 MG tablet Take 10 mg by mouth daily. Patient not taking: Reported on 06/13/2023    [provider]  FLUoxetine (PROZAC) 10 MG tablet Take 1 tablet (10 mg total) by mouth daily. Patient not taking: Reported on 06/13/2023 03/17/22   Copland, Ilona Sorrel, PA-C  ibuprofen (ADVIL,MOTRIN) 200 MG tablet Take 200 mg by mouth every 6 (six) hours as needed.     [provider]    Family History Family History  Problem Relation Age of Onset   Diabetes Mother        TYPE 2   Breast  cancer Neg Hx     Social History Social History   Tobacco Use   Smoking status: Every Day    Current packs/day: 0.25    Types: Cigarettes   Smokeless tobacco: Never  Vaping Use   Vaping status: Never Used  Substance Use Topics   Alcohol use: Not Currently   Drug use: No     Allergies   Sulfa antibiotics   Review of Systems Review of Systems  Constitutional:  Negative for chills and fever.  HENT:  Negative for ear pain and sore throat.   Respiratory:  Negative for cough and shortness of breath.   Cardiovascular:  Negative for chest pain and palpitations.  Musculoskeletal:  Negative for arthralgias, gait problem and joint swelling.  Skin:  Positive for color change and rash.  Neurological:  Negative for weakness and numbness.     Physical Exam Triage Vital Signs ED Triage Vitals [06/26/23 1639]  Encounter Vitals Group     BP      Systolic BP Percentile      Diastolic BP Percentile      Pulse Rate  90     Resp 18     Temp 98.6 F (37 C)     Temp src      SpO2 96 %     Weight      Height      Head Circumference      Peak Flow      Pain Score      Pain Loc      Pain Education      Exclude from Growth Chart    No data found.  Updated Vital Signs BP 124/80   Pulse 90   Temp 98.6 F (37 C)   Resp 18   LMP 11/25/2015 (Approximate)   SpO2 96%   Visual Acuity Right Eye Distance:   Left Eye Distance:   Bilateral Distance:    Right Eye Near:   Left Eye Near:    Bilateral Near:     Physical Exam Vitals and nursing note reviewed.  Constitutional:      General: She is not in acute distress.    Appearance: She is well-developed.  HENT:     Mouth/Throat:     Mouth: Mucous membranes are moist.  Cardiovascular:     Rate and Rhythm: Normal rate and regular rhythm.     Heart sounds: Normal heart sounds.  Pulmonary:     Effort: Pulmonary effort is normal. No respiratory distress.     Breath sounds: Normal breath sounds.  Musculoskeletal:        General: No swelling or deformity. Normal range of motion.     Cervical back: Neck supple.  Skin:    General: Skin is warm and dry.     Findings: Rash present.     Comments: Patchy vesicular rash on right thigh which follows a dermatome line.  Neurological:     General: No focal deficit present.     Mental Status: She is alert and oriented to person, place, and time.     Sensory: No sensory deficit.     Motor: No weakness.     Gait: Gait normal.  Psychiatric:        Mood and Affect: Mood normal.        Behavior: Behavior normal.      UC Treatments / Results  Labs (all labs ordered are listed, but only abnormal results are displayed) Labs Reviewed - No data to  display  EKG   Radiology No results found.  Procedures Procedures (including critical care time)  Medications Ordered in UC Medications - No data to display  Initial Impression / Assessment and Plan / UC Course  I have reviewed the triage vital  signs and the nursing notes.  Pertinent labs & imaging results that were available during my care of the patient were reviewed by me and considered in my medical decision making (see chart for details).    Herpes zoster.  Afebrile and vital signs are stable.  Patient has a patchy vesicular rash on her right thigh.  Treating with Valtrex.  Instructed patient not to take the as needed Valtrex prescribed by her PCP while she is taking the dosage for her herpes zoster.  Instructed her to follow-up with her PCP tomorrow.  ED precautions given.  Education provided on shingles.  Patient agrees to plan of care.  Final Clinical Impressions(s) / UC Diagnoses   Final diagnoses:  Herpes zoster without complication     Discharge Instructions      Take the Valtrex as directed.  Follow up with your primary care provider tomorrow.  Go to the emergency department if you have worsening symptoms.        ED Prescriptions     Medication Sig Dispense Auth. Provider   valACYclovir (VALTREX) 1000 MG tablet Take 1 tablet (1,000 mg total) by mouth 3 (three) times daily. 21 tablet Mickie Bail, NP      PDMP not reviewed this encounter.   Mickie Bail, NP 06/26/23 [phone]

## 2023-06-26 NOTE — ED Triage Notes (Signed)
Patient to Urgent Care with complaints of a painful rash present to her right thigh.   Symptoms started last week. Concerned about shingles.

## 2023-06-27 ENCOUNTER — Ambulatory Visit: Payer: Self-pay

## 2023-07-18 ENCOUNTER — Telehealth: Payer: Self-pay

## 2023-07-18 ENCOUNTER — Telehealth: Payer: Self-pay | Admitting: *Deleted

## 2023-07-18 ENCOUNTER — Other Ambulatory Visit: Payer: Self-pay | Admitting: *Deleted

## 2023-07-18 DIAGNOSIS — Z1211 Encounter for screening for malignant neoplasm of colon: Secondary | ICD-10-CM

## 2023-07-18 MED ORDER — NA SULFATE-K SULFATE-MG SULF 17.5-3.13-1.6 GM/177ML PO SOLN
1.0000 | Freq: Once | ORAL | 0 refills | Status: AC
Start: 2023-07-18 — End: 2023-07-18

## 2023-07-18 NOTE — Telephone Encounter (Signed)
Colonoscopy schedule on 08/31/2023 with Dr Allegra Lai at [facility]

## 2023-07-18 NOTE — Telephone Encounter (Signed)
Gastroenterology Pre-Procedure Review  Request Date: 08/31/2023 Requesting Physician: Dr. Allegra Lai  PATIENT REVIEW QUESTIONS: The patient responded to the following health history questions as indicated:    1. Are you having any GI issues? no 2. Do you have a personal history of Polyps? no 3. Do you have a family history of Colon Cancer or Polyps? no 4. Diabetes Mellitus? no 5. Joint replacements in the past 12 months?no 6. Major health problems in the past 3 months?no 7. Any artificial heart valves, MVP, or defibrillator?no    MEDICATIONS & ALLERGIES:    Patient reports the following regarding taking any anticoagulation/antiplatelet therapy:   Plavix, Coumadin, Eliquis, Xarelto, Lovenox, Pradaxa, Brilinta, or Effient? no Aspirin? no  Patient confirms/reports the following medications:  Current Outpatient Medications  Medication Sig Dispense Refill   amphetamine-dextroamphetamine (ADDERALL) 10 MG tablet TAKE ONE TABLET BY MOUTH EVERY MORNING AND TAKE ONE TABLET EVERY EVENING; can fill after 08/31/23 60 tablet 0   azelastine (ASTELIN) 0.1 % nasal spray Place 1 spray into both nostrils 2 (two) times daily. Use in each nostril as directed 30 mL 12   cetirizine (ZYRTEC) 10 MG tablet Take 10 mg by mouth daily. (Patient not taking: Reported on 06/13/2023)     FLUoxetine (PROZAC) 10 MG tablet Take 1 tablet (10 mg total) by mouth daily. (Patient not taking: Reported on 06/13/2023) 90 tablet 3   ibuprofen (ADVIL,MOTRIN) 200 MG tablet Take 200 mg by mouth every 6 (six) hours as needed.      valACYclovir (VALTREX) 1000 MG tablet Take 1 tablet (1,000 mg total) by mouth 3 (three) times daily. 21 tablet 0   No current facility-administered medications for this visit.    Patient confirms/reports the following allergies:  Allergies  Allergen Reactions   Sulfa Antibiotics Other (See Comments)    Worsened symptoms     No orders of the defined types were placed in this encounter.   AUTHORIZATION  INFORMATION Primary Insurance: 1D#: Group #:  Secondary Insurance: 1D#: Group #:  SCHEDULE INFORMATION: Date: 08/31/2023 Time: Location:  [facility]

## 2023-07-18 NOTE — Telephone Encounter (Signed)
Patient called back in to schedule procedure.

## 2023-08-01 ENCOUNTER — Ambulatory Visit
Admission: RE | Admit: 2023-08-01 | Discharge: 2023-08-01 | Disposition: A | Discharge status: Home / Self Care | Payer: 59 | Source: Ambulatory Visit | Attending: Obstetrics and Gynecology | Admitting: Obstetrics and Gynecology

## 2023-08-01 DIAGNOSIS — Z1231 Encounter for screening mammogram for malignant neoplasm of breast: Secondary | ICD-10-CM | POA: Diagnosis present

## 2023-08-03 ENCOUNTER — Other Ambulatory Visit: Payer: Self-pay | Admitting: Obstetrics and Gynecology

## 2023-08-03 DIAGNOSIS — R928 Other abnormal and inconclusive findings on diagnostic imaging of breast: Secondary | ICD-10-CM

## 2023-08-03 DIAGNOSIS — R921 Mammographic calcification found on diagnostic imaging of breast: Secondary | ICD-10-CM

## 2023-08-15 ENCOUNTER — Encounter: Payer: Self-pay | Admitting: Obstetrics and Gynecology

## 2023-08-15 ENCOUNTER — Ambulatory Visit
Admission: RE | Admit: 2023-08-15 | Discharge: 2023-08-15 | Disposition: A | Discharge status: Home / Self Care | Payer: 59 | Source: Ambulatory Visit | Attending: Obstetrics and Gynecology | Admitting: Obstetrics and Gynecology

## 2023-08-15 DIAGNOSIS — R928 Other abnormal and inconclusive findings on diagnostic imaging of breast: Secondary | ICD-10-CM | POA: Insufficient documentation

## 2023-08-15 DIAGNOSIS — R921 Mammographic calcification found on diagnostic imaging of breast: Secondary | ICD-10-CM | POA: Insufficient documentation

## 2023-08-29 ENCOUNTER — Telehealth: Payer: Self-pay | Admitting: *Deleted

## 2023-08-29 ENCOUNTER — Other Ambulatory Visit: Payer: Self-pay | Admitting: *Deleted

## 2023-08-29 DIAGNOSIS — Z1211 Encounter for screening for malignant neoplasm of colon: Secondary | ICD-10-CM

## 2023-08-29 NOTE — Telephone Encounter (Signed)
Patient called office stating that she needs to reschedule because she is having flu like symptoms.   Requesting to reschedule to 10/05/2023.  Called endo unit, spoken to [facility] to make the change.  New instructions will be sent. Patient have already picked up prep.  Patient verbalized understanding.

## 2023-08-31 ENCOUNTER — Ambulatory Visit: Admission: RE | Admit: 2023-08-31 | Payer: 59 | Source: Ambulatory Visit | Admitting: Gastroenterology

## 2023-08-31 SURGERY — COLONOSCOPY WITH PROPOFOL
Anesthesia: General

## 2023-09-28 ENCOUNTER — Encounter: Payer: Self-pay | Admitting: Gastroenterology

## 2023-10-05 ENCOUNTER — Ambulatory Visit: Payer: 59 | Admitting: Anesthesiology

## 2023-10-05 ENCOUNTER — Other Ambulatory Visit: Payer: Self-pay

## 2023-10-05 ENCOUNTER — Ambulatory Visit
Admission: RE | Admit: 2023-10-05 | Discharge: 2023-10-05 | Disposition: A | Discharge status: Home / Self Care | Payer: 59 | Source: Ambulatory Visit | Attending: Gastroenterology | Admitting: Gastroenterology

## 2023-10-05 ENCOUNTER — Encounter
Admission: RE | Disposition: A | Discharge status: Home / Self Care | Payer: Self-pay | Source: Ambulatory Visit | Attending: Gastroenterology

## 2023-10-05 ENCOUNTER — Encounter: Payer: Self-pay | Admitting: Gastroenterology

## 2023-10-05 DIAGNOSIS — K562 Volvulus: Secondary | ICD-10-CM

## 2023-10-05 DIAGNOSIS — J449 Chronic obstructive pulmonary disease, unspecified: Secondary | ICD-10-CM | POA: Insufficient documentation

## 2023-10-05 DIAGNOSIS — F17200 Nicotine dependence, unspecified, uncomplicated: Secondary | ICD-10-CM | POA: Insufficient documentation

## 2023-10-05 DIAGNOSIS — K648 Other hemorrhoids: Secondary | ICD-10-CM | POA: Insufficient documentation

## 2023-10-05 DIAGNOSIS — K644 Residual hemorrhoidal skin tags: Secondary | ICD-10-CM | POA: Insufficient documentation

## 2023-10-05 DIAGNOSIS — Z1211 Encounter for screening for malignant neoplasm of colon: Secondary | ICD-10-CM | POA: Diagnosis present

## 2023-10-05 DIAGNOSIS — K219 Gastro-esophageal reflux disease without esophagitis: Secondary | ICD-10-CM | POA: Diagnosis not present

## 2023-10-05 DIAGNOSIS — Z13220 Encounter for screening for lipoid disorders: Secondary | ICD-10-CM

## 2023-10-05 HISTORY — PX: COLONOSCOPY WITH PROPOFOL: SHX5780

## 2023-10-05 SURGERY — COLONOSCOPY WITH PROPOFOL
Anesthesia: General

## 2023-10-05 MED ORDER — LIDOCAINE HCL (PF) 2 % IJ SOLN
INTRAMUSCULAR | Status: AC
Start: 2023-10-05 — End: ?
  Filled 2023-10-05: qty 5

## 2023-10-05 MED ORDER — PROPOFOL 1000 MG/100ML IV EMUL
INTRAVENOUS | Status: AC
  Filled 2023-10-05: qty 100

## 2023-10-05 MED ORDER — PHENYLEPHRINE 80 MCG/ML (10ML) SYRINGE FOR IV PUSH (FOR BLOOD PRESSURE SUPPORT)
PREFILLED_SYRINGE | INTRAVENOUS | Status: AC
  Filled 2023-10-05: qty 10

## 2023-10-05 MED ORDER — PROPOFOL 10 MG/ML IV BOLUS
INTRAVENOUS | Status: DC | PRN
  Administered 2023-10-05: 50 mg via INTRAVENOUS

## 2023-10-05 MED ORDER — LIDOCAINE HCL (PF) 1 % IJ SOLN
INTRAMUSCULAR | Status: AC
  Filled 2023-10-05: qty 2

## 2023-10-05 MED ORDER — SODIUM CHLORIDE 0.9 % IV SOLN: INTRAVENOUS | Status: DC

## 2023-10-05 MED ORDER — PROPOFOL 500 MG/50ML IV EMUL
INTRAVENOUS | Status: DC | PRN
  Administered 2023-10-05: 75 ug/kg/min via INTRAVENOUS

## 2023-10-05 MED ORDER — ALBUTEROL SULFATE HFA 108 (90 BASE) MCG/ACT IN AERS
INHALATION_SPRAY | RESPIRATORY_TRACT | Status: AC
  Filled 2023-10-05: qty 6.7

## 2023-10-05 MED ORDER — GLYCOPYRROLATE 0.2 MG/ML IJ SOLN
INTRAMUSCULAR | Status: AC
  Filled 2023-10-05: qty 1

## 2023-10-05 MED ORDER — EPHEDRINE 5 MG/ML INJ
INTRAVENOUS | Status: AC
  Filled 2023-10-05: qty 5

## 2023-10-05 MED ORDER — LIDOCAINE 1% INJECTION FOR CIRCUMCISION
0.1000 mL | INJECTION | Freq: Once | INTRAVENOUS | Status: AC
  Administered 2023-10-05: 0.1 mL via SUBCUTANEOUS
  Filled 2023-10-05: qty 1

## 2023-10-05 MED ORDER — LIDOCAINE HCL (CARDIAC) PF 100 MG/5ML IV SOSY
PREFILLED_SYRINGE | INTRAVENOUS | Status: DC | PRN
  Administered 2023-10-05: 40 mg via INTRAVENOUS

## 2023-10-05 NOTE — Anesthesia Postprocedure Evaluation (Signed)
 Anesthesia Post Note  Patient: Christina Reyes  Procedure(s) Performed: COLONOSCOPY WITH PROPOFOL   Patient location during evaluation: Endoscopy Anesthesia Type: General Level of consciousness: awake and alert Pain management: pain level controlled Vital Signs Assessment: post-procedure vital signs reviewed and stable Respiratory status: spontaneous breathing, nonlabored ventilation, respiratory function stable and patient connected to nasal cannula oxygen Cardiovascular status: blood pressure returned to baseline and stable Postop Assessment: no apparent nausea or vomiting Anesthetic complications: no   No notable events documented.   Last Vitals:  Vitals:   10/05/23 1026 10/05/23 1046  BP:  117/83  Pulse:    Resp:    Temp: (!) 36.1 C   SpO2:  100%    Last Pain:  Vitals:   10/05/23 1026  TempSrc: Temporal  PainSc: Asleep                 Christina Reyes

## 2023-10-05 NOTE — H&P (Signed)
 Christina JONELLE Brooklyn, MD [address]  [address]  [place], [place] [address]  Main: [phone]  Fax: [phone] Pager: [phone]  Primary Care Physician:  Pcp, No Primary Gastroenterologist:  Dr. Corinn JONELLE Reyes  Pre-Procedure History & Physical: HPI:  Christina Reyes is a 58 y.o. female is here for an colonoscopy.   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 Authorizing Provider  amphetamine -dextroamphetamine  (ADDERALL) 10 MG tablet TAKE ONE TABLET BY MOUTH EVERY MORNING AND TAKE ONE TABLET EVERY EVENING; can fill after 08/31/23 06/13/23   Copland, Alicia B, PA-C  azelastine  (ASTELIN ) 0.1 % nasal spray Place 1 spray into both nostrils 2 (two) times daily. Use in each nostril as directed 10/06/22 11/05/22  Immordino, Garnette, FNP  cetirizine (ZYRTEC) 10 MG tablet Take 10 mg by mouth daily. Patient not taking: Reported on 06/13/2023    [provider]  FLUoxetine  (PROZAC ) 10 MG tablet Take 1 tablet (10 mg total) by mouth daily. Patient not taking: Reported on 06/13/2023 03/17/22   Copland, Alicia B, PA-C  ibuprofen  (ADVIL ,MOTRIN ) 200 MG tablet Take 200 mg by mouth every 6 (six) hours as needed.     [provider]   valACYclovir  (VALTREX ) 1000 MG tablet Take 1 tablet (1,000 mg total) by mouth 3 (three) times daily. 06/26/23   Corlis Burnard DEL, NP    Allergies as of 08/29/2023 - Review Complete 06/26/2023  Allergen Reaction Noted   Sulfa antibiotics Other (See Comments) 02/04/2015    Family History  Problem Relation Age of Onset   Diabetes Mother        TYPE 2   Breast cancer Neg Hx     Social History   Socioeconomic History   Marital status: Divorced    Spouse name: Not on file   Number of children: 2   Years of education: Not on file   Highest education level: Not on file  Occupational History   Not on file  Tobacco Use   Smoking status: Every Day    Current packs/day: 0.25    Types: Cigarettes   Smokeless tobacco: Never  Vaping Use   Vaping status: Never Used  Substance and Sexual Activity   Alcohol use: Not Currently   Drug use: No   Sexual activity: Not Currently    Birth control/protection: Post-menopausal  Other Topics Concern   Not on file  Social History Narrative   Not on file   Social Drivers of Health   Financial Resource Strain: Not on file  Food Insecurity: Not on file  Transportation Needs: Not on file  Physical Activity: Not on file  Stress: Not  on file  Social Connections: Not on file  Intimate Partner Violence: Not on file    Review of Systems: See HPI, otherwise negative ROS  Physical Exam: BP (!) 155/88   Pulse 97   Temp 97.6 F (36.4 C) (Temporal)   Resp 20   Ht 5' 1 (1.549 m)   Wt 34.9 kg   LMP 11/25/2015 (Approximate)   SpO2 100%   BMI 14.55 kg/m  General:   Alert,  pleasant and cooperative in NAD Head:  Normocephalic and atraumatic. Neck:  Supple; no masses or thyromegaly. Lungs:  Clear throughout to auscultation.    Heart:  Regular rate and rhythm. Abdomen:  Soft, nontender and nondistended. Normal bowel sounds, without guarding, and without rebound.   Neurologic:  Alert and  oriented x4;  grossly normal  neurologically.  Impression/Plan: Christina Reyes is here for an colonoscopy to be performed for colon cancer screening  Risks, benefits, limitations, and alternatives regarding  colonoscopy have been reviewed with the patient.  Questions have been answered.  All parties agreeable.   Christina Brooklyn, MD  10/05/2023, 9:58 AM

## 2023-10-05 NOTE — Transfer of Care (Signed)
 Immediate Anesthesia Transfer of Care Note  Patient: Christina Reyes  Procedure(s) Performed: COLONOSCOPY WITH PROPOFOL   Patient Location: PACU  Anesthesia Type:General  Level of Consciousness: sedated  Airway & Oxygen Therapy: Patient Spontanous Breathing  Post-op Assessment: Report given to RN and Post -op Vital signs reviewed and stable  Post vital signs: Reviewed and stable  Last Vitals:  Vitals Value Taken Time  BP 123/77 10/05/23 1026  Temp 36.1 C 10/05/23 1026  Pulse 73 10/05/23 1026  Resp    SpO2 100 % 10/05/23 1026  Vitals shown include unfiled device data.  Last Pain:  Vitals:   10/05/23 1026  TempSrc: Temporal  PainSc: Asleep         Complications: No notable events documented.

## 2023-10-05 NOTE — Op Note (Signed)
 [facility] Patient Name: Christina Reyes Procedure Date: 10/05/2023 9:51 AM MRN: [id number] Account #: [id number] Date of Birth: [birth year] Admit Type: Outpatient Age: 58 Room: [facility] ENDO ROOM 3 Gender: Female Note Status: Finalized Instrument Name: Peds Colonoscope [id number] Procedure:             Colonoscopy Indications:           Screening for colorectal malignant neoplasm, This is                         the patient's first colonoscopy Providers:             Corinn Jess Brooklyn MD, MD Referring MD:          No Local Md, MD (Referring MD) Medicines:             General Anesthesia Complications:         No immediate complications. Estimated blood loss: None. Procedure:             Pre-Anesthesia Assessment:                        - Prior to the procedure, a History and Physical was                         performed, and patient medications and allergies were                         reviewed. The patient is competent. The risks and                         benefits of the procedure and the sedation options and                         risks were discussed with the patient. All questions                         were answered and informed consent was obtained.                         Patient identification and proposed procedure were                         verified by the physician, the nurse, the                         anesthesiologist, the anesthetist and the technician                         in the pre-procedure area in the procedure room in the                         endoscopy suite. Mental Status Examination: alert and                         oriented. Airway Examination: normal oropharyngeal                         airway and neck mobility. Respiratory Examination:  clear to auscultation. CV Examination: normal.                         Prophylactic Antibiotics: The patient does not require                         prophylactic  antibiotics. Prior Anticoagulants: The                         patient has taken no anticoagulant or antiplatelet                         agents. ASA Grade Assessment: III - A patient with                         severe systemic disease. After reviewing the risks and                         benefits, the patient was deemed in satisfactory                         condition to undergo the procedure. The anesthesia                         plan was to use general anesthesia. Immediately prior                         to administration of medications, the patient was                         re-assessed for adequacy to receive sedatives. The                         heart rate, respiratory rate, oxygen saturations,                         blood pressure, adequacy of pulmonary ventilation, and                         response to care were monitored throughout the                         procedure. The physical status of the patient was                         re-assessed after the procedure.                        After obtaining informed consent, the colonoscope was                         passed under direct vision. Throughout the procedure,                         the patient's blood pressure, pulse, and oxygen                         saturations were monitored continuously. The  Colonoscope was introduced through the anus and                         advanced to the the cecum, identified by appendiceal                         orifice and ileocecal valve. The colonoscopy was                         performed with moderate difficulty due to significant                         looping. Successful completion of the procedure was                         aided by applying abdominal pressure. The patient                         tolerated the procedure well. The quality of the bowel                         preparation was evaluated using the BBPS [facility] Bowel                          Preparation Scale) with scores of: Right Colon = 3,                         Transverse Colon = 3 and Left Colon = 3 (entire mucosa                         seen well with no residual staining, small fragments                         of stool or opaque liquid). The total BBPS score                         equals 9. The ileocecal valve, appendiceal orifice,                         and rectum were photographed. Findings:      The perianal and digital rectal examinations were normal. Pertinent       negatives include normal sphincter tone and no palpable rectal lesions.      The entire examined colon appeared normal.      Non-bleeding external and internal hemorrhoids were found during       retroflexion. The hemorrhoids were large. Impression:            - The entire examined colon is normal.                        - Non-bleeding external and internal hemorrhoids.                        - No specimens collected. Recommendation:        - Discharge patient to home (with escort).                        -  Resume previous diet today.                        - Continue present medications.                        - Repeat colonoscopy in 10 years for screening                         purposes. Procedure Code(s):     --- Professional ---                        H9878, Colorectal cancer screening; colonoscopy on                         individual not meeting criteria for high risk Diagnosis Code(s):     --- Professional ---                        Z12.11, Encounter for screening for malignant neoplasm                         of colon                        K64.8, Other hemorrhoids CPT copyright 2022 [employer]. All rights reserved. The codes documented in this report are preliminary and upon coder review may  be revised to meet current compliance requirements. Dr. Angelita Brooklyn Corinn Jess Brooklyn MD, MD 10/05/2023 10:29:03 AM This report has been signed electronically. Number of  Addenda: 0 Note Initiated On: 10/05/2023 9:51 AM Scope Withdrawal Time: 0 hours 7 minutes 43 seconds  Total Procedure Duration: 0 hours 13 minutes 23 seconds  Estimated Blood Loss:  Estimated blood loss: none.      [facility]

## 2023-10-05 NOTE — Anesthesia Preprocedure Evaluation (Signed)
 Anesthesia Evaluation  Patient identified by MRN, date of birth, ID band Patient awake    Reviewed: Allergy & Precautions, NPO status , Patient's Chart, lab work & pertinent test results  History of Anesthesia Complications Negative for: history of anesthetic complications  Airway Mallampati: III  TM Distance: >3 FB Neck ROM: full    Dental  (+) Chipped, Poor Dentition   Pulmonary shortness of breath and with exertion, COPD, Current Smoker and Patient abstained from smoking.   Pulmonary exam normal        Cardiovascular Exercise Tolerance: Good (-) angina negative cardio ROS Normal cardiovascular exam     Neuro/Psych  Headaches PSYCHIATRIC DISORDERS         GI/Hepatic Neg liver ROS,GERD  Controlled,,  Endo/Other  negative endocrine ROS    Renal/GU negative Renal ROS  negative genitourinary   Musculoskeletal   Abdominal   Peds  Hematology negative hematology ROS (+)   Anesthesia Other Findings Past Medical History: 09/10/1993: Abnormal Pap smear of cervix     Comment:  LGSIL No date: ADD (attention deficit disorder) No date: Depression No date: Ear pain No date: GERD (gastroesophageal reflux disease) 10/26/2012: Head ache 02/14/2015; 04/01/2016: History of mammogram     Comment:  BIRAD 1; NEG 10/26/2012; 03/17/2016: History of Papanicolaou smear of cervix     Comment:  -/-; -/- 12/12/2013: Oral herpes No date: Seasonal allergies 10/26/2012: Syncope and collapse  Past Surgical History: No date: BREAST BIOPSY; Left     Comment:  CORE - NEG 03/19/2019: CHOLECYSTECTOMY; N/A     Comment:  Procedure: LAPAROSCOPIC CHOLECYSTECTOMY;  Surgeon:               Jordis Laneta FALCON, MD;  Location: [facility] ORS;  Service:               General;  Laterality: N/A; No date: TONSILLECTOMY AND ADENOIDECTOMY     Reproductive/Obstetrics negative OB ROS                             Anesthesia  Physical Anesthesia Plan  ASA: 3  Anesthesia Plan: General   Post-op Pain Management:    Induction: Intravenous  PONV Risk Score and Plan: Propofol  infusion and TIVA  Airway Management Planned: Natural Airway and Nasal Cannula  Additional Equipment:   Intra-op Plan:   Post-operative Plan:   Informed Consent: I have reviewed the patients History and Physical, chart, labs and discussed the procedure including the risks, benefits and alternatives for the proposed anesthesia with the patient or authorized representative who has indicated his/her understanding and acceptance.     Dental Advisory Given  Plan Discussed with: Anesthesiologist, CRNA and Surgeon  Anesthesia Plan Comments: (Patient consented for risks of anesthesia including but not limited to:  - adverse reactions to medications - risk of airway placement if required - damage to eyes, teeth, lips or other oral mucosa - nerve damage due to positioning  - sore throat or hoarseness - Damage to heart, brain, nerves, lungs, other parts of body or loss of life  Patient voiced understanding and assent.)       Anesthesia Quick Evaluation

## 2023-10-06 ENCOUNTER — Encounter: Payer: Self-pay | Admitting: Gastroenterology

## 2023-10-19 ENCOUNTER — Other Ambulatory Visit: Payer: Self-pay | Admitting: Obstetrics and Gynecology

## 2023-10-19 DIAGNOSIS — F988 Other specified behavioral and emotional disorders with onset usually occurring in childhood and adolescence: Secondary | ICD-10-CM

## 2023-10-22 ENCOUNTER — Other Ambulatory Visit: Payer: Self-pay | Admitting: Obstetrics and Gynecology

## 2023-10-22 DIAGNOSIS — F988 Other specified behavioral and emotional disorders with onset usually occurring in childhood and adolescence: Secondary | ICD-10-CM

## 2023-10-22 MED ORDER — AMPHETAMINE-DEXTROAMPHETAMINE 10 MG PO TABS: ORAL_TABLET | ORAL | 0 refills | Status: DC

## 2023-10-22 NOTE — Progress Notes (Signed)
Rx RF adderall for 3 months

## 2024-01-23 ENCOUNTER — Other Ambulatory Visit: Payer: Self-pay | Admitting: Obstetrics and Gynecology

## 2024-01-23 DIAGNOSIS — F988 Other specified behavioral and emotional disorders with onset usually occurring in childhood and adolescence: Secondary | ICD-10-CM

## 2024-01-23 MED ORDER — AMPHETAMINE-DEXTROAMPHETAMINE 10 MG PO TABS
ORAL_TABLET | ORAL | 0 refills | Status: DC
Start: 2024-01-23 — End: 2024-05-13

## 2024-01-23 MED ORDER — AMPHETAMINE-DEXTROAMPHETAMINE 10 MG PO TABS: ORAL_TABLET | ORAL | 0 refills | Status: DC

## 2024-01-23 NOTE — Progress Notes (Signed)
 Rx RF adderall for 3 months

## 2024-02-13 ENCOUNTER — Ambulatory Visit: Payer: Self-pay | Admitting: Obstetrics and Gynecology

## 2024-02-13 ENCOUNTER — Ambulatory Visit
Admission: RE | Admit: 2024-02-13 | Discharge: 2024-02-13 | Disposition: A | Discharge status: Home / Self Care | Payer: 59 | Source: Ambulatory Visit | Attending: Obstetrics and Gynecology | Admitting: Obstetrics and Gynecology

## 2024-02-13 DIAGNOSIS — R921 Mammographic calcification found on diagnostic imaging of breast: Secondary | ICD-10-CM | POA: Diagnosis present

## 2024-02-13 DIAGNOSIS — R928 Other abnormal and inconclusive findings on diagnostic imaging of breast: Secondary | ICD-10-CM

## 2024-03-19 DIAGNOSIS — R0600 Dyspnea, unspecified: Secondary | ICD-10-CM | POA: Insufficient documentation

## 2024-03-19 DIAGNOSIS — K219 Gastro-esophageal reflux disease without esophagitis: Secondary | ICD-10-CM | POA: Insufficient documentation

## 2024-03-19 DIAGNOSIS — J449 Chronic obstructive pulmonary disease, unspecified: Secondary | ICD-10-CM | POA: Insufficient documentation

## 2024-04-23 ENCOUNTER — Other Ambulatory Visit: Payer: Self-pay | Admitting: Obstetrics and Gynecology

## 2024-04-23 ENCOUNTER — Encounter: Payer: Self-pay | Admitting: Obstetrics and Gynecology

## 2024-05-11 ENCOUNTER — Other Ambulatory Visit: Payer: Self-pay | Admitting: Obstetrics and Gynecology

## 2024-05-11 DIAGNOSIS — F988 Other specified behavioral and emotional disorders with onset usually occurring in childhood and adolescence: Secondary | ICD-10-CM

## 2024-06-11 ENCOUNTER — Ambulatory Visit

## 2024-06-11 VITALS — BP 134/84 | HR 97 | Ht 60.0 in | Wt 88.0 lb

## 2024-06-11 DIAGNOSIS — R928 Other abnormal and inconclusive findings on diagnostic imaging of breast: Secondary | ICD-10-CM

## 2024-06-11 DIAGNOSIS — Z131 Encounter for screening for diabetes mellitus: Secondary | ICD-10-CM

## 2024-06-11 DIAGNOSIS — R7989 Other specified abnormal findings of blood chemistry: Secondary | ICD-10-CM | POA: Insufficient documentation

## 2024-06-11 DIAGNOSIS — R634 Abnormal weight loss: Secondary | ICD-10-CM | POA: Insufficient documentation

## 2024-06-11 DIAGNOSIS — J301 Allergic rhinitis due to pollen: Secondary | ICD-10-CM | POA: Diagnosis not present

## 2024-06-11 DIAGNOSIS — K219 Gastro-esophageal reflux disease without esophagitis: Secondary | ICD-10-CM | POA: Diagnosis not present

## 2024-06-11 DIAGNOSIS — Z130 Encounter for screening for diseases of the blood and blood-forming organs and certain disorders involving the immune mechanism: Secondary | ICD-10-CM | POA: Diagnosis not present

## 2024-06-11 DIAGNOSIS — R41840 Attention and concentration deficit: Secondary | ICD-10-CM

## 2024-06-11 DIAGNOSIS — Z1159 Encounter for screening for other viral diseases: Secondary | ICD-10-CM | POA: Insufficient documentation

## 2024-06-11 DIAGNOSIS — R140 Abdominal distension (gaseous): Secondary | ICD-10-CM | POA: Insufficient documentation

## 2024-06-11 DIAGNOSIS — Z13220 Encounter for screening for lipoid disorders: Secondary | ICD-10-CM

## 2024-06-11 DIAGNOSIS — J432 Centrilobular emphysema: Secondary | ICD-10-CM | POA: Diagnosis not present

## 2024-06-11 DIAGNOSIS — R935 Abnormal findings on diagnostic imaging of other abdominal regions, including retroperitoneum: Secondary | ICD-10-CM

## 2024-06-11 HISTORY — DX: Abnormal weight loss: R63.4

## 2024-06-11 HISTORY — DX: Other specified abnormal findings of blood chemistry: R79.89

## 2024-06-11 HISTORY — DX: Abdominal distension (gaseous): R14.0

## 2024-06-11 LAB — CBC WITH DIFFERENTIAL/PLATELET
Basophils Absolute: 0 K/uL (ref 0.0–0.1)
Basophils Relative: 0.5 % (ref 0.0–3.0)
Eosinophils Absolute: 0.4 K/uL (ref 0.0–0.7)
Eosinophils Relative: 5.4 % — ABNORMAL HIGH (ref 0.0–5.0)
HCT: 38.4 % (ref 36.0–46.0)
Hemoglobin: 12.8 g/dL (ref 12.0–15.0)
Lymphocytes Relative: 27.4 % (ref 12.0–46.0)
Lymphs Abs: 2.2 K/uL (ref 0.7–4.0)
MCHC: 33.5 g/dL (ref 30.0–36.0)
MCV: 94.1 fl (ref 78.0–100.0)
Monocytes Absolute: 0.9 K/uL (ref 0.1–1.0)
Monocytes Relative: 10.8 % (ref 3.0–12.0)
Neutro Abs: 4.5 K/uL (ref 1.4–7.7)
Neutrophils Relative %: 55.9 % (ref 43.0–77.0)
Platelets: 358 K/uL (ref 150.0–400.0)
RBC: 4.08 Mil/uL (ref 3.87–5.11)
RDW: 13.2 % (ref 11.5–15.5)
WBC: 8.1 K/uL (ref 4.0–10.5)

## 2024-06-11 LAB — LIPID PANEL
Cholesterol: 248 mg/dL — ABNORMAL HIGH (ref 0–200)
HDL: 78.8 mg/dL (ref >39.00)
LDL Cholesterol: 145 mg/dL — ABNORMAL HIGH (ref 0–99)
NonHDL: 169.24
Total CHOL/HDL Ratio: 3
Triglycerides: 119 mg/dL (ref 0.0–149.0)
VLDL: 23.8 mg/dL (ref 0.0–40.0)

## 2024-06-11 LAB — T4, FREE: Free T4: 0.73 ng/dL (ref 0.60–1.60)

## 2024-06-11 LAB — HEMOGLOBIN A1C: Hgb A1c MFr Bld: 6.1 % (ref 4.6–6.5)

## 2024-06-11 LAB — VITAMIN D 25 HYDROXY (VIT D DEFICIENCY, FRACTURES): VITD: 63.76 ng/mL (ref 30.00–100.00)

## 2024-06-11 LAB — VITAMIN B12: Vitamin B-12: 494 pg/mL (ref 211–911)

## 2024-06-11 LAB — TSH: TSH: 1.27 u[IU]/mL (ref 0.35–5.50)

## 2024-06-11 MED ORDER — AMPHETAMINE-DEXTROAMPHETAMINE 5 MG PO TABS: 5.0000 mg | ORAL_TABLET | Freq: Two times a day (BID) | ORAL | 0 refills | Status: DC | PRN

## 2024-06-11 MED ORDER — FLUTICASONE PROPIONATE 50 MCG/ACT NA SUSP: 2.0000 | Freq: Every day | NASAL | 2 refills | Status: AC

## 2024-06-11 MED ORDER — TRELEGY ELLIPTA 100-62.5-25 MCG/ACT IN AEPB: 1.0000 | INHALATION_SPRAY | Freq: Every day | RESPIRATORY_TRACT | 2 refills | Status: DC

## 2024-06-11 NOTE — Assessment & Plan Note (Signed)
 Lab ordered.

## 2024-06-11 NOTE — Assessment & Plan Note (Signed)
 Patient was found to have low TSH during hospitalization in 03/19/24, this could be subclinical hypothyroidism, will repeat TSH, t4 today. Management pending result.

## 2024-06-11 NOTE — Assessment & Plan Note (Addendum)
--  Allergen avoidance. Recommend trial of nasal Fluticasone  2 puffs in each nostril daily during pollen season, if symptoms not controlled on Flonase  okay to take Zyrtec 10 mg daily, prn.

## 2024-06-11 NOTE — Assessment & Plan Note (Signed)
 Currently prescribed Adderall 10 mg, she takes this once a day. Given potential s/e of loss of appetite, recommend dose adjustment to Adderall 5 mg, BID (take one tablet in the morning before work and 1 tablet around noon) during work week. Not needing refill at this time. Previously managed by her OB/Gyn. Last refill for Adderall 10 mg 60 tablets was done on 05/13/24, pdmp reviewed. Future prescription for Adderall 5 mg, twice daily was sent to the pharmacy. F/U in 2 months.

## 2024-06-11 NOTE — Patient Instructions (Addendum)
 1) Schedule for diagnostic mammogram towards the end of November PLEASE CALL AND GET THIS SCHEDULED! [facility] - call [phone] .   2) Reduce dose of Adderall from 10 mg daily to 5 mg twice a day. Take one tab before work and 1 tab around lunch time.   3) I have put in a referral for GI Dr. Unk and pulmonology clinic with [facility] ([place]) for COPD evaluation. Please reach out to our office if you do not hear for appointment within next couple of weeks.   4) Start using nasal flonase , 2 puffs in each nostril daily to help with nasal discharge/congestion or allergy symptoms during pollen high season. You must use Flonase  for at least one week for it to work. It's okay for you to take Zyrtec if you continue to have symptoms of allergy despite using Flonase .   5) F/U with Dr. Abbey in 2 months. Sooner if anything new arise.

## 2024-06-11 NOTE — Addendum Note (Signed)
 Addended by: Danie Diehl on: 06/11/2024 04:43 PM   Modules accepted: Orders

## 2024-06-11 NOTE — Assessment & Plan Note (Signed)
 Check lipid panel

## 2024-06-11 NOTE — Assessment & Plan Note (Addendum)
 New diagnosis with hospitalization in 02/2024. Risk factor: h/o smoking. Has not smoked since her hospitalization in June.  Currently patient is using Albuterol  prn for rescue (which she has not needed) and Trelegy ellipta  daily for maintenance with duoneb when she remembers.  I recommend continue use of daily maintenance: Trelegy, 1 puff, prn Albuterol  for rescue and get PFT for diagnosis confirmation and pulmonology referral for appropriate evaluation and management.  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

## 2024-06-11 NOTE — Assessment & Plan Note (Signed)
 Plan per abnormal CT of abdomen, will need EGD and GI evaluation. Urgent referral to GI made today.

## 2024-06-11 NOTE — Assessment & Plan Note (Signed)
 Previous mammogram abnormal, is due for repeat diagnostic mammogram around November 20, imaging ordered. Patient provided with phone number to schedule for appointment as well.

## 2024-06-11 NOTE — Assessment & Plan Note (Addendum)
 Slowly gaining weight since her hospitalization in 03/19/2024.  No fever, vomiting, blood in stool.  D/D includes COPD cachexia, currently on appropriate therapy, not smoking. Referral to pulmonology made today.  ADHD, medication s/e Adderall (plan per ADHD) GI (plan per abnormal CT abdomen) Abnormal mammogram history from 01/2024/concern for malignancy: Repeat diagnostic mammogram ordered.  Continue high-calorie, high protein diet.   Will obtain following labs for further evaluation:  - CBC  - TSH, T4 - Comp Met (CMET) - Vitamin D  (25 hydroxy) - B12 - Iron, TIBC and Ferritin Panel

## 2024-06-11 NOTE — Assessment & Plan Note (Addendum)
 Happening mostly after dinner which seems to be larger meal compared to lunch.  Recommend patient keep a symptom journal, and plan per abnormal CT abdomen from today.

## 2024-06-11 NOTE — Assessment & Plan Note (Addendum)
 03/19/24 CT abdomen and pelvis with contrast:  - Circumferential wall thickening of the sigmoid colon, possibly secondary to recurrent/chronic diverticulitis. However, consider further evaluation via colonoscopy to exclude underlying neoplastic process.  -  Fluid in the distal thoracic esophagus, possibly reflux esophagitis versus delayed esophageal clearance. Given mild esophageal wall thickening of the distal thoracic esophagus, consider further evaluation via endoscopy.   - On exam patient appears icteric, has pain on deep palpation over epigastric area. Will check CBC, iron panel, CMP today. Urgent referral to GI (previous patient of Dr. Unk) will be done. At the meantime for GERD, continue Prilosec 20 mg daily.   - UDT On screening colonoscopy 09/2023 with Dr. Unk.

## 2024-06-11 NOTE — Progress Notes (Addendum)
 New Patient Office Visit   Subjective   Patient ID: Christina Reyes, female    DOB: [birth year]  Age: 58 y.o. MRN: [id number]  CC:  Chief Complaint  Patient presents with   Establish Care   Foot Swelling   COPD   GI Problem   Skin Problem   Referral    HPI Christina Reyes presents to establish care. She  has a past medical history of Abnormal Pap smear of cervix (09/10/1993), ADD (attention deficit disorder), Allergy, Cholecystitis (03/18/2019), COPD (chronic obstructive pulmonary disease) (HCC) ([id number]), Depression, Ear pain, GERD (gastroesophageal reflux disease), Head ache (10/26/2012), History of mammogram (02/14/2015; 04/01/2016), History of Papanicolaou smear of cervix (10/26/2012; 03/17/2016), Oral herpes (12/12/2013), Seasonal allergies, and Syncope and collapse (10/26/2012).  HPI Discussed the use of AI scribe software for clinical note transcription with the patient, who gave verbal consent to proceed.  History of Present Illness Christina Reyes is a 58 year old female with COPD who presents with shortness of breath and abdominal discomfort.  She was admitted to [facility] in June for shortness of breath, where imaging was performed and she was told she may have had a COPD exacerbation. She has a history of smoking, which she quit in June during her hospital stay. She uses Trelegy Ellipta  daily as a maintenance inhaler and albuterol  as a rescue inhaler, which she has used a few times since her hospital discharge, particularly when mowing the grass. No current shortness of breath, fever, chills, or unintentional weight loss. Occasional congestion and fall allergies, for which she takes Zyrtec in the evening. Nasal congestion persists, and she has previously used a nasal spray, but the prescription expired.  She experiences abdominal discomfort, particularly feeling bloated after eating, more so in the evenings. She has a history of gallbladder removal in 2020 and has  experienced weight loss since then, although she has gained weight since her hospital discharge. She drinks two Ensure shakes daily and is eating better. She has a history of acid reflux and takes Prilosec, which has helped alleviate symptoms. Occasional night sweats, which she attributes to menopause.  She has ADHD and takes Adderall, one tablet in the morning, although she is prescribed two tablets a day. She works for an [employer] and has been on Adderall for years. She quit smoking in June and used nicotine patches during the cessation process.  She has a history of hemorrhoids, which occasionally become inflamed and painful. She reports regular bowel movements, although she has had diarrhea frequently in the past, especially after her gallbladder surgery.  COPD exacerbation, hospitalized in 03/19/24.  confluent centrilobular emphysema with diffuse mild bronchial wall thickening compatible with smoking-related lung disease. -  CT A/P showed circumferential wall thickening of the sigmoid colon and mild esophageal wall thickening of the distal thoracic esophagus which may need to be further evaluated outpatient. Nutrition and GI were consulted. GI recommended that she establish care with outpatient GI and to pursue an EGD and possibly repeat colonoscopy to evaluate CT findings.    Consider outpatient lab workup for malabsorption (fat soluble vitamins, B12, less c/f EPI given nml pancreas on CT). She was started on Omeprazole 20 mg daily, rec vitamin D , 2,000 units, Adderall 10 mg BID.Thiamine 100 mg, B1, TRELEGY ELLIPTA  100-62.5-25 mcg inhaler Generic drug: fluticasone -umeclidin-vilanter Inhale 1 puff daily. nicotine polacrilex 4 MG gum,nicotine 21 mg/24 hr patch,ipratropium-albuterol  0.5-2.5 mg/3 mL nebulizer. Duo-Neb    Outpatient Encounter Medications as of 06/11/2024  Medication  Sig   albuterol  (VENTOLIN  HFA) 108 (90 Base) MCG/ACT inhaler Inhale 2 puffs into the lungs.   [START  ON 07/13/2024] amphetamine -dextroamphetamine  (ADDERALL) 5 MG tablet Take 1 tablet (5 mg total) by mouth 2 (two) times daily as needed.   Cholecalciferol (D3 2000) 50 MCG (2000 UT) CAPS    Ferrous Sulfate (IRON PO) Take 1 tablet by mouth daily.   fluticasone  (FLONASE ) 50 MCG/ACT nasal spray Place 2 sprays into both nostrils daily.   ipratropium-albuterol  (DUONEB) 0.5-2.5 (3) MG/3ML SOLN Inhale 3 mLs into the lungs.   Multiple Vitamins-Minerals (CENTRUM SILVER 50+WOMEN) TABS    omeprazole (PRILOSEC) 20 MG capsule Take 20 mg by mouth daily.   thiamine (VITAMIN B1) 100 MG tablet Take 100 mg by mouth daily.   valACYclovir  (VALTREX ) 1000 MG tablet Take 1 tablet (1,000 mg total) by mouth 3 (three) times daily.   [DISCONTINUED] amphetamine -dextroamphetamine  (ADDERALL) 10 MG tablet TAKE ONE TABLET BY MOUTH EVERY MORNING AND EVERY EVENING   [DISCONTINUED] azelastine  (ASTELIN ) 0.1 % nasal spray Place 1 spray into both nostrils 2 (two) times daily. Use in each nostril as directed   [DISCONTINUED] ibuprofen  (ADVIL ,MOTRIN ) 200 MG tablet Take 200 mg by mouth every 6 (six) hours as needed.    [DISCONTINUED] TRELEGY ELLIPTA  100-62.5-25 MCG/ACT AEPB    TRELEGY ELLIPTA  100-62.5-25 MCG/ACT AEPB Inhale 1 puff into the lungs daily.   No facility-administered encounter medications on file as of 06/11/2024.    Past Surgical History:  Procedure Laterality Date   BREAST BIOPSY Left    CORE - NEG   CHOLECYSTECTOMY N/A 03/19/2019   Procedure: LAPAROSCOPIC CHOLECYSTECTOMY;  Surgeon: Jordis Laneta FALCON, MD;  Location: [facility] ORS;  Service: General;  Laterality: N/A;   COLONOSCOPY WITH PROPOFOL  N/A 10/05/2023   Procedure: COLONOSCOPY WITH PROPOFOL ;  Surgeon: Unk Corinn Skiff, MD;  Location: [facility] ENDOSCOPY;  Service: Gastroenterology;  Laterality: N/A;   TONSILLECTOMY AND ADENOIDECTOMY      ROS As per HPI    Objective    BP 134/84 (BP Location: Right Arm, Patient Position: Sitting, Cuff Size: Small)   Pulse 97   Ht 5'  (1.524 m)   Wt 88 lb (39.9 kg)   LMP 11/25/2015 (Approximate)   SpO2 98%   BMI 17.19 kg/m     06/11/2024    9:05 AM  Depression screen PHQ 2/9  Decreased Interest 0  Down, Depressed, Hopeless 0  PHQ - 2 Score 0  Altered sleeping 1  Tired, decreased energy 1  Change in appetite 0  Feeling bad or failure about yourself  0  Trouble concentrating 0  Moving slowly or fidgety/restless 0  Suicidal thoughts 0  PHQ-9 Score 2  Difficult doing work/chores Somewhat difficult      06/11/2024    9:05 AM  GAD 7 : Generalized Anxiety Score  Nervous, Anxious, on Edge 1  Control/stop worrying 0  Worry too much - different things 0  Trouble relaxing 0  Restless 0  Easily annoyed or irritable 1  Afraid - awful might happen 0  Total GAD 7 Score 2  Anxiety Difficulty Not difficult at all    SDOH Screenings   Food Insecurity: No Food Insecurity (06/10/2024)  Housing: Low Risk  (06/10/2024)  Transportation Needs: No Transportation Needs (06/10/2024)  Depression (PHQ2-9): Low Risk  (06/11/2024)  Financial Resource Strain: Medium Risk (06/10/2024)  Physical Activity: Insufficiently Active (06/10/2024)  Social Connections: Moderately Isolated (06/10/2024)  Stress: Stress Concern Present (06/10/2024)  Tobacco Use: Medium Risk (06/11/2024)  Physical Exam Constitutional:      General: She is not in acute distress.    Comments: Thin appearing  HENT:     Head: Normocephalic and atraumatic.     Right Ear: Tympanic membrane normal.     Left Ear: Tympanic membrane normal.     Mouth/Throat:     Mouth: Mucous membranes are moist.  Eyes:     General: Scleral icterus present.  Cardiovascular:     Rate and Rhythm: Normal rate.     Heart sounds: No murmur heard. Pulmonary:     Effort: Pulmonary effort is normal.     Breath sounds: Normal breath sounds. No wheezing.  Abdominal:     Palpations: Abdomen is soft.     Tenderness: There is abdominal tenderness (generalized tendernes on palpation  over epigastric region). There is no guarding or rebound.  Musculoskeletal:     Cervical back: No rigidity.     Right lower leg: No edema.     Left lower leg: No edema.  Skin:    General: Skin is warm.     Coloration: Skin is jaundiced.  Neurological:     Mental Status: She is alert and oriented to person, place, and time.  Psychiatric:        Mood and Affect: Mood normal.       03/19/24 CT chest with contrast:  Confluent centrilobular emphysema with diffuse mild bronchial wall thickening compatible with smoking-related lung disease.   03/19/24 CT abdomen and pelvis with contrast:  - Circumferential wall thickening of the sigmoid colon, possibly secondary to recurrent/chronic diverticulitis. However, consider further evaluation via colonoscopy to exclude underlying neoplastic process.  -  Fluid in the distal thoracic esophagus, possibly reflux esophagitis versus delayed esophageal clearance. Given mild esophageal wall thickening of the distal thoracic esophagus, consider further evaluation via endoscopy.     Lab Results  Component Value Date   TSH 1.27 06/11/2024   Lab Results  Component Value Date   WBC 8.1 06/11/2024   HGB 12.8 06/11/2024   HCT 38.4 06/11/2024   MCV 94.1 06/11/2024   PLT 358.0 06/11/2024   Lab Results  Component Value Date   NA 134 (L) 06/11/2024   K 4.6 06/11/2024   CO2 32 06/11/2024   GLUCOSE 67 (L) 06/11/2024   BUN 23 06/11/2024   CREATININE 0.82 06/11/2024   BILITOT 0.5 06/11/2024   ALKPHOS 143 (H) 06/11/2024   AST 27 06/11/2024   ALT 30 06/11/2024   PROT 6.9 06/11/2024   ALBUMIN 4.1 06/11/2024   CALCIUM 9.8 06/11/2024   ANIONGAP 12 03/18/2019   GFR 78.54 06/11/2024    Assessment & Plan:  Patient is a pleasant 58 year old female who has not seen a PCP in a while presenting for new patient visit, hospital follow up from 03/20/24 and other chronic concerns:  Mammogram abnormal Assessment & Plan: Previous mammogram abnormal, is due for  repeat diagnostic mammogram around November 20, imaging ordered. Patient provided with phone number to schedule for appointment as well.   Orders: -     MM 3D DIAGNOSTIC MAMMOGRAM BILATERAL BREAST; Future  Seasonal allergic rhinitis due to pollen Assessment & Plan: --Allergen avoidance. Recommend trial of nasal Fluticasone  2 puffs in each nostril daily during pollen season, if symptoms not controlled on Flonase  okay to take Zyrtec 10 mg daily, prn.        Orders: -     Fluticasone  Propionate; Place 2 sprays into both nostrils daily.  Dispense: 18.2 mL;  Refill: 2  Gastroesophageal reflux disease, unspecified whether esophagitis present Assessment & Plan: Plan per abnormal CT of abdomen, will need EGD and GI evaluation. Urgent referral to GI made today.   Orders: -     Ambulatory referral to Gastroenterology  Centrilobular emphysema [facility]) Assessment & Plan: New diagnosis with hospitalization in 02/2024. Risk factor: h/o smoking. Has not smoked since her hospitalization in June.  Currently patient is using Albuterol  prn for rescue (which she has not needed) and Trelegy ellipta  daily for maintenance with duoneb when she remembers.  I recommend continue use of daily maintenance: Trelegy, 1 puff, prn Albuterol  for rescue and get PFT for diagnosis confirmation and pulmonology referral for appropriate evaluation and management.  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   Orders: -     Trelegy Ellipta ; Inhale 1 puff into the lungs daily.  Dispense: 60 each; Refill: 2 -     Pulmonary Visit  Low TSH level Assessment & Plan: Patient was found to have low TSH during hospitalization in 03/19/24, this could be subclinical hypothyroidism, will repeat TSH, t4 today. Management pending result.   Orders: -     TSH -     T4, free  Screening for deficiency anemia Assessment & Plan: Check lipid panel.   Orders: -     CBC with  Differential/Platelet  Encounter for screening examination for intermediate hyperglycemia and diabetes mellitus Assessment & Plan: Check lipid panel.   Orders: -     Hemoglobin A1c  Weight loss, unintentional Assessment & Plan: Slowly gaining weight since her hospitalization in 03/19/2024.  No fever, vomiting, blood in stool.  D/D includes COPD cachexia, currently on appropriate therapy, not smoking. Referral to pulmonology made today.  ADHD, medication s/e Adderall (plan per ADHD) GI (plan per abnormal CT abdomen) Abnormal mammogram history from 01/2024/concern for malignancy: Repeat diagnostic mammogram ordered.  Continue high-calorie, high protein diet.   Will obtain following labs for further evaluation:  - CBC  - TSH, T4 - Comp Met (CMET) - Vitamin D  (25 hydroxy) - B12 - Iron, TIBC and Ferritin Panel  Orders: -     Comprehensive metabolic panel with GFR -     VITAMIN D  25 Hydroxy (Vit-D Deficiency, Fractures) -     Vitamin B12 -     Iron, TIBC and Ferritin Panel  Screening for lipoid disorders Assessment & Plan: Check lipid panel.   Orders: -     Lipid panel  Need for hepatitis C screening test Assessment & Plan: Lab ordered.   Orders: -     Hepatitis C antibody  Abdominal bloating Assessment & Plan: Happening mostly after dinner which seems to be larger meal compared to lunch.  Recommend patient keep a symptom journal, and plan per abnormal CT abdomen from today.   Orders: -     Ambulatory referral to Gastroenterology  Abnormal CT of the abdomen Assessment & Plan: 03/19/24 CT abdomen and pelvis with contrast:  - Circumferential wall thickening of the sigmoid colon, possibly secondary to recurrent/chronic diverticulitis. However, consider further evaluation via colonoscopy to exclude underlying neoplastic process.  -  Fluid in the distal thoracic esophagus, possibly reflux esophagitis versus delayed esophageal clearance. Given mild esophageal wall  thickening of the distal thoracic esophagus, consider further evaluation via endoscopy.   - On exam patient appears icteric, has pain on deep palpation over epigastric area. Will check CBC, iron panel, CMP today. Urgent referral to GI (previous patient  of Dr. Unk) will be done. At the meantime for GERD, continue Prilosec 20 mg daily.   - UDT On screening colonoscopy 09/2023 with Dr. Unk.    Attention or concentration deficit Assessment & Plan: Currently prescribed Adderall 10 mg, she takes this once a day. Given potential s/e of loss of appetite, recommend dose adjustment to Adderall 5 mg, BID (take one tablet in the morning before work and 1 tablet around noon) during work week. Not needing refill at this time. Previously managed by her OB/Gyn. Last refill for Adderall 10 mg 60 tablets was done on 05/13/24, pdmp reviewed. Future prescription for Adderall 5 mg, twice daily was sent to the pharmacy. F/U in 2 months.   Orders: -     Amphetamine -Dextroamphetamine ; Take 1 tablet (5 mg total) by mouth 2 (two) times daily as needed.  Dispense: 60 tablet; Refill: 0   Return in about 8 weeks (around 08/06/2024) for Chronic follow up .   Luke Shade, MD

## 2024-06-12 ENCOUNTER — Telehealth: Payer: Self-pay

## 2024-06-12 ENCOUNTER — Ambulatory Visit: Payer: Self-pay

## 2024-06-12 DIAGNOSIS — R928 Other abnormal and inconclusive findings on diagnostic imaging of breast: Secondary | ICD-10-CM

## 2024-06-12 LAB — IRON,TIBC AND FERRITIN PANEL
%SAT: 29 % (ref 16–45)
Ferritin: 154 ng/mL (ref 16–232)
Iron: 102 ug/dL (ref 45–160)
TIBC: 356 ug/dL (ref 250–450)

## 2024-06-12 LAB — HEPATITIS C ANTIBODY: Hepatitis C Ab: NONREACTIVE

## 2024-06-12 NOTE — Telephone Encounter (Signed)
 Dr. Jinny I was hoping to curbside you about this patient, 58 year old, h/o 20+yr smoking with weight loss, GERD, appears icteric on exam, has epigastric pain who had CT abdomen and pelvis in 03/19/2024 at [facility] and was found to have Circumferential wall thickening of the sigmoid colon, possibly secondary to recurrent/chronic diverticulitis. However, consider further evaluation via colonoscopy to exclude underlying neoplastic process.  2. Fluid in the distal thoracic esophagus, possibly reflux esophagitis versus delayed esophageal clearance. Given mild esophageal wall thickening of the distal thoracic esophagus, consider further evaluation via endoscopy.   20 mins KB Labs with normal LFTs, electrolytes but elevated ALP. I meant to finish my message but ended up sending it to you. I have a GI referral for her to get EGD, would you recommend repeat imaging, any other evaluation when she waits to be seen by GI? She also has ADHD and is on Addrell (I reduced her dose as she can't fully come off the medication). I appreciate your time and expertise. Thank you  18 mins DW Rogelia Jinny, MD [facility] didn't do any follow upon these finding on CT?  17 mins Luke Shade, MD No sir, they told her to go f/u with PCP, she only saw ob/gyn and never had a PCP. I am concerned about adenocarcinoma...  1  KB and I saw her as a new patient yesterday and I am just really worried about her..  14 mins DW Rogelia Jinny, MD To start off with I would fractionate the all phos. To see if is bone or liver. Also GGT is part of the workup for increased all phos. A repeat CT scan can be helpful but agree an egd and colonoscopy would be prudent. I can have her seen by my NP and then we can set those up if that's ok.

## 2024-06-12 NOTE — Telephone Encounter (Signed)
 Patient scheduled for 9/22 with Grayce

## 2024-06-12 NOTE — Progress Notes (Signed)
 Updated patient about lab results via phone call. Patient is prediabetic but also had low point of care blood glucose. LDL slightly elevated but not a candidate for statin. Given low BMI recommend focus on eating healthy, balanced diet. CBC reassuring. Will monitor electrolytes. Reassuring thyroid function, iron studies. Does have elevated ALP plus abnormal CT done at [facility] on 03/19/24 with continuing epigastric pain. I curbsided GI/Dr.Wohl prior to talking to the patient (appreciate GI input on patient care). His office will call the patient to schedule an appointment with plan to check GGT, repeat Ct abdomen, EGD, colonoscopy as they deem important based on patient evaluation. Patient has appointment with pulmonologist Dr. Tamea on 07/18/24. Keep appointment with me for 08/12/2024. Patient verbalized understanding and agrees with above plan.   Luke Shade, MD

## 2024-06-15 LAB — COMPREHENSIVE METABOLIC PANEL WITH GFR
ALT: 30 U/L (ref 0–35)
AST: 27 U/L (ref 0–37)
Albumin: 4.1 g/dL (ref 3.5–5.2)
Alkaline Phosphatase: 143 U/L — ABNORMAL HIGH (ref 39–117)
BUN: 23 mg/dL (ref 6–23)
CO2: 32 meq/L (ref 19–32)
Calcium: 9.8 mg/dL (ref 8.4–10.5)
Chloride: 100 meq/L (ref 96–112)
Creatinine, Ser: 0.82 mg/dL (ref 0.40–1.20)
GFR: 78.54 mL/min (ref >60.00)
Glucose, Bld: 67 mg/dL — ABNORMAL LOW (ref 70–99)
Potassium: 4.6 meq/L (ref 3.5–5.1)
Sodium: 139 meq/L (ref 135–145)
Total Bilirubin: 0.5 mg/dL (ref 0.2–1.2)
Total Protein: 6.9 g/dL (ref 6.0–8.3)

## 2024-06-16 NOTE — Progress Notes (Unsigned)
 06/17/2024 Christina Reyes [id number] [birth year]  Gastroenterology Office Note    Referring Provider: Bair, Kalpana, MD Primary Care Physician:  Bair, Kalpana, MD  Primary GI Provider: Jinny Carmine, MD    Chief Complaint   Abdominal pain and abnormal CT    History of Present Illness   Christina Reyes is a 58 y.o. female with PMHX of ADD, COPD, GERD, cholecystitis, depression, presenting today at the request of Bair, Luke, MD due to abnormal CT scan at [facility] and has occasional abdominal pain. Patient is being seen by GI after following up with new PCP with abnormal CT abdomen pelvis 02/2024 and now elevated alkaline phosphatase.   Patient reports mild occasional abdominal pain. States it can be epigastric or lower abdominal pain. Has a formed bowel movement every 1 to 2 days. Reports history of hemorrhoids and may see bright red blood on the tissue when she wipes 1-2 times a month. Denies melena. Denies nausea/vomiting.   She is currently on omeprazole and states this works well for her. She may have occasional bloating sensation after she eats dinner once a week. Rare NSAID use, may take ibuprofen  once a month. Denies alcohol use.  Drinks 1 cup of coffee in the morning, along with 2 sodas daily.  States she does not drink water often.   Of note, she reports she started losing weight after cholecystectomy. Weight steady at 90 pounds. She drinks 2 ensures daily.    03/19/24 CT abdomen and pelvis with contrast:  - Circumferential wall thickening of the sigmoid colon, possibly secondary to recurrent/chronic diverticulitis. However, consider further evaluation via colonoscopy to exclude underlying neoplastic process.  -  Fluid in the distal thoracic esophagus, possibly reflux esophagitis versus delayed esophageal clearance. Given mild esophageal wall thickening of the distal thoracic esophagus, consider further evaluation via endoscopy.   10/05/2023 Colonoscopy - The entire examined colon  is normal.  - Non-bleeding external and internal hemorrhoids.  - No specimens collected  Denies family hx of colon cancer.   Past Medical History:  Diagnosis Date   Abnormal Pap smear of cervix 09/10/1993   LGSIL   ADD (attention deficit disorder)    Allergy    Seasonal   Cholecystitis 03/18/2019   COPD (chronic obstructive pulmonary disease) (HCC) [id number]   Depression    Ear pain    GERD (gastroesophageal reflux disease)    Head ache 10/26/2012   History of mammogram 02/14/2015; 04/01/2016   BIRAD 1; NEG   History of Papanicolaou smear of cervix 10/26/2012; 03/17/2016   -/-; -/-   Oral herpes 12/12/2013   Seasonal allergies    Syncope and collapse 10/26/2012    Past Surgical History:  Procedure Laterality Date   BREAST BIOPSY Left    CORE - NEG   CHOLECYSTECTOMY N/A 03/19/2019   Procedure: LAPAROSCOPIC CHOLECYSTECTOMY;  Surgeon: Jordis Laneta FALCON, MD;  Location: [facility] ORS;  Service: General;  Laterality: N/A;   COLONOSCOPY WITH PROPOFOL  N/A 10/05/2023   Procedure: COLONOSCOPY WITH PROPOFOL ;  Surgeon: Unk Corinn Skiff, MD;  Location: [facility] ENDOSCOPY;  Service: Gastroenterology;  Laterality: N/A;   TONSILLECTOMY AND ADENOIDECTOMY      Current Outpatient Medications  Medication Sig Dispense Refill   Cholecalciferol (D3 2000) 50 MCG (2000 UT) CAPS      fluticasone  (FLONASE ) 50 MCG/ACT nasal spray Place 2 sprays into both nostrils daily. 18.2 mL 2   Multiple Vitamins-Minerals (CENTRUM SILVER 50+WOMEN) TABS      omeprazole (PRILOSEC) 20 MG capsule Take 20  mg by mouth daily.     thiamine  (VITAMIN B1) 100 MG tablet Take 100 mg by mouth daily.     valACYclovir  (VALTREX ) 1000 MG tablet Take 1 tablet (1,000 mg total) by mouth 3 (three) times daily. 21 tablet 0   amphetamine -dextroamphetamine  (ADDERALL) 10 MG tablet Take 10 mg by mouth.     No current facility-administered medications for this visit.    Allergies as of 06/17/2024 - Review Complete 06/17/2024  Allergen Reaction  Noted   Sulfa antibiotics Other (See Comments) 02/04/2015    Family History  Problem Relation Age of Onset   Diabetes Mother        TYPE 2   Heart disease Mother    Kidney disease Father    Heart failure Father    Breast cancer Neg Hx     Social History   Socioeconomic History   Marital status: Divorced    Spouse name: Not on file   Number of children: 2   Years of education: Not on file   Highest education level: Associate degree: occupational, Scientist, product/process development, or vocational program  Occupational History   Not on file  Tobacco Use   Smoking status: Former    Current packs/day: 0.00    Types: Cigarettes    Quit date: 03/22/2024    Years since quitting: 0.2   Smokeless tobacco: Never  Vaping Use   Vaping status: Never Used  Substance and Sexual Activity   Alcohol use: Not Currently   Drug use: No   Sexual activity: Not Currently    Birth control/protection: Post-menopausal  Other Topics Concern   Not on file  Social History Narrative   Not on file   Social Drivers of Health   Financial Resource Strain: Medium Risk (06/10/2024)   Overall Financial Resource Strain (CARDIA)    Difficulty of Paying Living Expenses: Somewhat hard  Food Insecurity: No Food Insecurity (06/10/2024)   Hunger Vital Sign    Worried About Running Out of Food in the Last Year: Never true    Ran Out of Food in the Last Year: Never true  Transportation Needs: No Transportation Needs (06/10/2024)   PRAPARE - Administrator, Civil Service (Medical): No    Lack of Transportation (Non-Medical): No  Physical Activity: Insufficiently Active (06/10/2024)   Exercise Vital Sign    Days of Exercise per Week: 4 days    Minutes of Exercise per Session: 20 min  Stress: Stress Concern Present (06/10/2024)   [employer] of Occupational Health - Occupational Stress Questionnaire    Feeling of Stress: To some extent  Social Connections: Moderately Isolated (06/10/2024)   Social Connection and  Isolation Panel    Frequency of Communication with Friends and Family: More than three times a week    Frequency of Social Gatherings with Friends and Family: Three times a week    Attends Religious Services: 1 to 4 times per year    Active Member of Clubs or Organizations: No    Attends Engineer, structural: Not on file    Marital Status: Divorced  Intimate Partner Violence: Not on file     RELEVANT GI HISTORY, IMAGING AND LABS: CBC    Component Value Date/Time   WBC 8.1 06/11/2024 0955   RBC 4.08 06/11/2024 0955   HGB 12.8 06/11/2024 0955   HCT 38.4 06/11/2024 0955   PLT 358.0 06/11/2024 0955   MCV 94.1 06/11/2024 0955   MCH 31.7 03/18/2019 1358   MCHC 33.5 06/11/2024 0955  RDW 13.2 06/11/2024 0955   LYMPHSABS 2.2 06/11/2024 0955   MONOABS 0.9 06/11/2024 0955   EOSABS 0.4 06/11/2024 0955   BASOSABS 0.0 06/11/2024 0955   Recent Labs    06/11/24 0955  HGB 12.8    CMP     Component Value Date/Time   NA 139 06/11/2024 0955   K 4.6 06/11/2024 0955   CL 100 06/11/2024 0955   CO2 32 06/11/2024 0955   GLUCOSE 67 (L) 06/11/2024 0955   BUN 23 06/11/2024 0955   CREATININE 0.82 06/11/2024 0955   CALCIUM 9.8 06/11/2024 0955   PROT 6.9 06/11/2024 0955   ALBUMIN 4.1 06/11/2024 0955   AST 27 06/11/2024 0955   ALT 30 06/11/2024 0955   ALKPHOS 143 (H) 06/11/2024 0955   BILITOT 0.5 06/11/2024 0955   GFRNONAA >60 03/18/2019 1358   GFRAA >60 03/18/2019 1358      Latest Ref Rng & Units 06/11/2024    9:55 AM 03/18/2019    1:58 PM  Hepatic Function  Total Protein 6.0 - 8.3 g/dL 6.9  7.6   Albumin 3.5 - 5.2 g/dL 4.1  4.6   AST 0 - 37 U/L 27  25   ALT 0 - 35 U/L 30  18   Alk Phosphatase 39 - 117 U/L 143  96   Total Bilirubin 0.2 - 1.2 mg/dL 0.5  0.6       Review of Systems   All systems reviewed and negative except where noted in HPI.    Physical Exam  BP 126/69   Pulse 80   Temp 98.8 F (37.1 C)   Ht 5' (1.524 m)   Wt 90 lb 12.8 oz (41.2 kg)   LMP  11/25/2015 (Approximate)   BMI 17.73 kg/m  Patient's last menstrual period was 11/25/2015 (approximate). General:   Alert and oriented. Pleasant and cooperative. No acute distress. Thin appearing.   Head:  Normocephalic and atraumatic. Eyes:  Without icterus Ears:  Normal auditory acuity. Neck:  Supple; no masses or thyromegaly. Lungs:  Respirations even and unlabored. Clear throughout to auscultation. No wheezes, crackles, or rhonchi. No acute distress. Heart:  Regular rate and rhythm; no murmurs, clicks, rubs, or gallops. Abdomen:  Normal bowel sounds.  No bruits.  Soft, non-tender and non-distended without masses, hepatosplenomegaly or hernias noted.  No guarding or rebound tenderness.   Rectal:  Deferred. Msk:  Symmetrical without gross deformities. Normal posture. Extremities:  Without edema. Neurologic:  Alert and  oriented x4;  grossly normal neurologically. Skin:  Intact without significant lesions or rashes. Psych:  Alert and cooperative. Normal mood and affect.   Assessment & Plan   Christina Reyes is a 58 y.o. female presenting today with concerns for abnormal CT of abdomen, elevated alkaline phosphatase, and mild abdominal pain.  Elevated alk phos - 06/11/2024 alk phos 143 - Labs- GGT, ALP isoenzymes - further testing pending lab results  Abnormal CT Abd/pelvis: circumferential wall thickening of the sigmoid colon and mild esophageal wall thickening of the distal thoracic esophagus  -Repeat Ct Abd/pelvis with contrast -Proceed with upper endoscopy with Dr. Jinny in near future: the risks, benefits, and alternatives have been discussed with the patient in detail, which include, but are not limited to: bleeding, infection, perforation & drug reaction. The patient states understanding and desires to proceed. - Patient reluctant to have repeat colonoscopy, agreeable for this to be added on with EGD pending results of repeat CT abd.   GERD: continue Omeprazole 20 mg daily. -  Limit intake of coffee, tea, alcohol, and carbonated drinks -Avoid meals and snacks three to four hours before bedtime. -Proceed with EGD due to long history of GERD and mild esophageal wall thickening seen on  CT.    Abdominal pain: mild occasional generalized abd pain. No tenderness on physical exam.   Will contact patient for follow-up pending further recommendations after labs/procedures.   I discussed the assessment and treatment plan with the patient. The patient was provided an opportunity to ask questions and all were answered. The patient agreed with the plan and demonstrated an understanding of the instructions.  Grayce Bohr, DNP, AGNP-C [facility] Gastroenterology

## 2024-06-17 ENCOUNTER — Ambulatory Visit (INDEPENDENT_AMBULATORY_CARE_PROVIDER_SITE_OTHER): Admitting: Family Medicine

## 2024-06-17 ENCOUNTER — Encounter: Payer: Self-pay | Admitting: Family Medicine

## 2024-06-17 VITALS — BP 126/69 | HR 80 | Temp 98.8°F | Ht 60.0 in | Wt 90.8 lb

## 2024-06-17 DIAGNOSIS — K219 Gastro-esophageal reflux disease without esophagitis: Secondary | ICD-10-CM | POA: Diagnosis not present

## 2024-06-17 DIAGNOSIS — R748 Abnormal levels of other serum enzymes: Secondary | ICD-10-CM | POA: Diagnosis not present

## 2024-06-17 DIAGNOSIS — R1084 Generalized abdominal pain: Secondary | ICD-10-CM

## 2024-06-17 DIAGNOSIS — R935 Abnormal findings on diagnostic imaging of other abdominal regions, including retroperitoneum: Secondary | ICD-10-CM | POA: Diagnosis not present

## 2024-06-17 DIAGNOSIS — K2100 Gastro-esophageal reflux disease with esophagitis, without bleeding: Secondary | ICD-10-CM

## 2024-06-17 DIAGNOSIS — R109 Unspecified abdominal pain: Secondary | ICD-10-CM

## 2024-06-17 NOTE — Progress Notes (Signed)
 Result was already conveyed to the patient via phone call.   Luke Shade, MD

## 2024-06-17 NOTE — Patient Instructions (Addendum)
 Please have labs drawn.   CT scheduled @ [facility] 9/24-25 arrive at 1:15 am. [address].   We will plan for upper endoscopy and may also need a colonoscopy pending CT results.    We will follow-up pending further recommendations.

## 2024-06-18 DIAGNOSIS — R634 Abnormal weight loss: Secondary | ICD-10-CM

## 2024-06-18 MED ORDER — THIAMINE HCL 100 MG PO TABS: 100.0000 mg | ORAL_TABLET | Freq: Every day | ORAL | 1 refills | Status: AC

## 2024-06-19 ENCOUNTER — Ambulatory Visit

## 2024-06-19 ENCOUNTER — Ambulatory Visit: Payer: Self-pay | Admitting: Family Medicine

## 2024-06-21 LAB — ALKALINE PHOSPHATASE ISOENZYMES
Alkaline Phosphatase: 161 IU/L — AB (ref 49–135)
BONE FRACTION %:: 22 %
Bone Fraction IU/L:: 35 IU/L (ref 18–57)
INTESTINAL FRAC.%:: 9 %
INTESTINALFRAC.IU/L:: 14 IU/L (ref 0–14)
LIVER FRACTION %:: 69 %
Liver Fraction IU/L:: 111 IU/L — AB (ref 23–85)

## 2024-06-21 LAB — GAMMA GT: GGT: 42 IU/L (ref 0–60)

## 2024-06-26 ENCOUNTER — Other Ambulatory Visit: Payer: Self-pay

## 2024-06-26 ENCOUNTER — Telehealth: Payer: Self-pay

## 2024-06-26 DIAGNOSIS — R748 Abnormal levels of other serum enzymes: Secondary | ICD-10-CM

## 2024-06-26 NOTE — Telephone Encounter (Signed)
 Spoke with patient- scheduled RUQ US  per Grayce- scheduled 06/27/24 @ [facility] @ 8:45 am -nothing by mouth after midnight. Patient aware.

## 2024-06-27 ENCOUNTER — Ambulatory Visit
Admission: RE | Admit: 2024-06-27 | Discharge: 2024-06-27 | Disposition: A | Discharge status: Home / Self Care | Source: Ambulatory Visit | Attending: Family Medicine | Admitting: Family Medicine

## 2024-06-27 DIAGNOSIS — R748 Abnormal levels of other serum enzymes: Secondary | ICD-10-CM | POA: Insufficient documentation

## 2024-06-28 ENCOUNTER — Ambulatory Visit: Payer: Self-pay | Admitting: Family Medicine

## 2024-06-28 ENCOUNTER — Ambulatory Visit
Admission: RE | Admit: 2024-06-28 | Discharge: 2024-06-28 | Disposition: A | Discharge status: Home / Self Care | Source: Ambulatory Visit | Attending: Gastroenterology | Admitting: Gastroenterology

## 2024-06-28 ENCOUNTER — Other Ambulatory Visit: Payer: Self-pay | Admitting: Family Medicine

## 2024-06-28 DIAGNOSIS — R1084 Generalized abdominal pain: Secondary | ICD-10-CM | POA: Insufficient documentation

## 2024-06-28 DIAGNOSIS — R748 Abnormal levels of other serum enzymes: Secondary | ICD-10-CM | POA: Insufficient documentation

## 2024-06-28 MED ORDER — IOHEXOL 300 MG/ML  SOLN
80.0000 mL | Freq: Once | INTRAMUSCULAR | Status: AC | PRN
  Administered 2024-06-28: 80 mL via INTRAVENOUS

## 2024-07-03 LAB — MITOCHONDRIAL ANTIBODIES: Mitochondrial Ab: <20 U (ref 0.0–20.0)

## 2024-07-04 ENCOUNTER — Ambulatory Visit: Payer: Self-pay | Admitting: Family Medicine

## 2024-07-04 DIAGNOSIS — R748 Abnormal levels of other serum enzymes: Secondary | ICD-10-CM

## 2024-07-18 ENCOUNTER — Ambulatory Visit: Admitting: Pulmonary Disease

## 2024-07-18 ENCOUNTER — Other Ambulatory Visit
Admission: RE | Admit: 2024-07-18 | Discharge: 2024-07-18 | Disposition: A | Discharge status: Home / Self Care | Source: Ambulatory Visit | Attending: Pulmonary Disease | Admitting: Pulmonary Disease

## 2024-07-18 ENCOUNTER — Encounter: Payer: Self-pay | Admitting: Pulmonary Disease

## 2024-07-18 VITALS — BP 126/86 | HR 90 | Temp 97.9°F | Ht 60.0 in | Wt 99.6 lb

## 2024-07-18 DIAGNOSIS — J439 Emphysema, unspecified: Secondary | ICD-10-CM

## 2024-07-18 DIAGNOSIS — K219 Gastro-esophageal reflux disease without esophagitis: Secondary | ICD-10-CM | POA: Diagnosis not present

## 2024-07-18 DIAGNOSIS — Z87891 Personal history of nicotine dependence: Secondary | ICD-10-CM | POA: Diagnosis not present

## 2024-07-18 NOTE — Patient Instructions (Signed)
 VISIT SUMMARY:  Today, you were seen for a follow-up appointment to evaluate your COPD condition. You were referred by your primary doctor to see a pulmonary specialist. We discussed your symptoms, current medications, and recent history, including your hospitalization in June where you were diagnosed with COPD. You have successfully quit smoking since your hospital admission.  YOUR PLAN:  -CHRONIC OBSTRUCTIVE PULMONARY DISEASE (COPD) WITH EMPHYSEMA: COPD with emphysema is a chronic lung condition that makes it hard to breathe due to damage to the air sacs in the lungs. You are currently managing well with your Trelegy inhaler and occasional use of albuterol . We will continue with these medications. We will also order pulmonary function tests to assess your lung function, schedule an annual low-dose CT scan for lung cancer screening due to your smoking history, and perform a blood test for alpha-1 antitrypsin deficiency, which can cause early onset emphysema. To help with your hoarseness, please rinse your mouth and gargle with baking soda after using your inhaler.  -GASTROESOPHAGEAL REFLUX DISEASE (GERD): GERD is a condition where stomach acid frequently flows back into the tube connecting your mouth and stomach, causing irritation. You are currently managing this with daily omeprazole, and we will continue this medication.  INSTRUCTIONS:  Please continue using your Trelegy inhaler and albuterol  as needed. Make sure to rinse your mouth and gargle with baking soda after using your inhaler to reduce hoarseness. We will schedule pulmonary function tests to assess your lung function and an annual low-dose CT scan for lung cancer screening. Additionally, we will perform a blood test for alpha-1 antitrypsin deficiency. Continue taking omeprazole daily for your GERD. Follow up with us  after completing these tests.

## 2024-07-18 NOTE — Progress Notes (Signed)
 Subjective:    Patient ID: Christina Reyes, female    DOB: 01-Feb-1966, 58 y.o.   MRN: [id number]  Patient Care Team: Bair, Kalpana, MD as PCP - [facility])  Chief Complaint  Patient presents with   Consult    Hx of COPD. Occasional shortness of breath on exertion.     BACKGROUND: Patient is a 58 year old former smoker who presents for evaluation and management of COPD.  Patient admitted from 19 March 2024 through 21 March 2024 at [facility] for COPD exacerbation.  Quit smoking after that admission.  She had rhinovirus at that time.  She is kindly referred by Dr. Luke Shade.  HPI Discussed the use of AI scribe software for clinical note transcription with the patient, who gave verbal consent to proceed.  History of Present Illness   He Ashely MILEAH Reyes is a 57 year old female with COPD who presents for evaluation of her condition. She was referred by her primary doctor for follow-up with a pulmonary specialist.  In late June, she experienced symptoms resembling bronchitis, which worsened significantly, leading to her admission to the hospital in [facility] where she was diagnosed with COPD. During this time, she experienced weight loss but has since regained the weight.  She is currently using Trelegy and she rinses her mouth after use. She occasionally uses a rescue inhaler, albuterol , but not frequently. She quit smoking on the day of her hospital admission, having previously smoked since the age of 89, with breaks during her pregnancies.  She has a 21-pack-year history of smoking.  She has not undergone formal pulmonary function tests but has used a spirometer-like device at home and in the hospital. She experiences occasional hoarseness, particularly when talking, but does not have a cough per se nor produce sputum. She also experiences reflux and takes omeprazole every morning.  She uses a nebulizer in the morning and evening, and as needed when experiencing  tightness or wheezing. She does not report cough or sputum production, but she reports occasional hoarseness and difficulty clearing her throat.      Review of Systems A 10 point review of systems was performed and it is as noted above otherwise negative.   Past Medical History:  Diagnosis Date   Abnormal Pap smear of cervix 09/10/1993   LGSIL   ADD (attention deficit disorder)    Allergy    Seasonal   Cholecystitis 03/18/2019   COPD (chronic obstructive pulmonary disease) (HCC) 93727974   Depression    Ear pain    GERD (gastroesophageal reflux disease)    Head ache 10/26/2012   History of mammogram 02/14/2015; 04/01/2016   BIRAD 1; NEG   History of Papanicolaou smear of cervix 10/26/2012; 03/17/2016   -/-; -/-   Oral herpes 12/12/2013   Seasonal allergies    Syncope and collapse 10/26/2012    Past Surgical History:  Procedure Laterality Date   BREAST BIOPSY Left    CORE - NEG   CHOLECYSTECTOMY N/A 03/19/2019   Procedure: LAPAROSCOPIC CHOLECYSTECTOMY;  Surgeon: Jordis Laneta FALCON, MD;  Location: [facility] ORS;  Service: General;  Laterality: N/A;   COLONOSCOPY WITH PROPOFOL  N/A 10/05/2023   Procedure: COLONOSCOPY WITH PROPOFOL ;  Surgeon: Unk Corinn Skiff, MD;  Location: [facility] ENDOSCOPY;  Service: Gastroenterology;  Laterality: N/A;   TONSILLECTOMY AND ADENOIDECTOMY      Patient Active Problem List   Diagnosis Date Noted   Low TSH level 06/11/2024   Mammogram abnormal 06/11/2024   Seasonal allergic rhinitis due  to pollen 06/11/2024   Weight loss, unintentional 06/11/2024   Need for hepatitis C screening test 06/11/2024   Abdominal bloating 06/11/2024   Abnormal CT of the abdomen 06/11/2024   GERD (gastroesophageal reflux disease) 03/19/2024   COPD (chronic obstructive pulmonary disease) (HCC) 03/19/2024   Screening for lipoid disorders 10/05/2023   Cold sore 07/06/2017   Anxiety 07/06/2017   ADD (attention deficit disorder) 03/16/2017    Family History  Problem Relation  Age of Onset   Diabetes Mother        TYPE 2   Heart disease Mother    Kidney disease Father    Heart failure Father    Breast cancer Neg Hx     Social History   Tobacco Use   Smoking status: Former    Current packs/day: 0.00    Average packs/day: 0.5 packs/day for 41.5 years (20.7 ttl pk-yrs)    Types: Cigarettes    Start date: 09/1982    Quit date: 03/22/2024    Years since quitting: 0.3   Smokeless tobacco: Never  Substance Use Topics   Alcohol use: Not Currently    Allergies  Allergen Reactions   Sulfa Antibiotics Other (See Comments)    Worsened symptoms     Current Meds  Medication Sig   Albuterol  Sulfate (PROAIR  RESPICLICK) 108 (90 Base) MCG/ACT AEPB Inhale 2 puffs into the lungs every 6 (six) hours as needed.   amphetamine -dextroamphetamine  (ADDERALL) 10 MG tablet Take 10 mg by mouth. (Patient taking differently: Take 5 mg by mouth.)   Cholecalciferol (D3 2000) 50 MCG (2000 UT) CAPS    fluticasone  (FLONASE ) 50 MCG/ACT nasal spray Place 2 sprays into both nostrils daily.   Fluticasone -Umeclidin-Vilant (TRELEGY ELLIPTA ) 200-62.5-25 MCG/ACT AEPB Inhale 1 puff into the lungs daily in the afternoon.   ipratropium-albuterol  (DUONEB) 0.5-2.5 (3) MG/3ML SOLN Take 3 mLs by nebulization every 6 (six) hours as needed.   Multiple Vitamins-Minerals (CENTRUM SILVER 50+WOMEN) TABS    omeprazole (PRILOSEC) 20 MG capsule Take 20 mg by mouth daily.   thiamine  (VITAMIN B1) 100 MG tablet Take 1 tablet (100 mg total) by mouth daily.   valACYclovir  (VALTREX ) 1000 MG tablet Take 1 tablet (1,000 mg total) by mouth 3 (three) times daily.     There is no immunization history on file for this patient.      Objective:     BP 126/86   Pulse 90   Temp 97.9 F (36.6 C) (Temporal)   Ht 5' (1.524 m)   Wt 99 lb 9.6 oz (45.2 kg)   LMP 11/25/2015 (Approximate)   SpO2 99%   BMI 19.45 kg/m   SpO2: 99 %  GENERAL: Well-developed, well-nourished woman, impeccably groomed.  Fully  ambulatory.  No conversational dyspnea. HEAD: Normocephalic, atraumatic.  EYES: Pupils equal, round, reactive to light.  No scleral icterus.  MOUTH: Poor dentition (chipped), oral mucosa moist. NECK: Supple. No thyromegaly. Trachea midline. No JVD.  No adenopathy. PULMONARY: Good air entry bilaterally.  No adventitious sounds. CARDIOVASCULAR: S1 and S2. Regular rate and rhythm.  No rubs, murmurs or gallops heard. ABDOMEN: Benign. MUSCULOSKELETAL: No joint deformity, no clubbing, no edema.  NEUROLOGIC: No overt focal deficit, no gait disturbance, speech is fluent. SKIN: Intact,warm,dry. PSYCH: Mood and behavior normal.  I have reviewed available laboratory data and imaging.   Assessment & Plan:     ICD-10-CM   1. Chronic obstructive pulmonary disease with emphysema, unspecified emphysema type (HCC)  J43.9 Pulmonary function test    Alpha-1-Antitrypsin Phenotyp  2. Gastroesophageal reflux disease, unspecified whether esophagitis present  K21.9     3. Personal history of tobacco use, presenting hazards to health  Z87.891 Ambulatory Referral for Lung Cancer Scre      Orders Placed This Encounter  Procedures   Alpha-1-Antitrypsin Phenotyp    Standing Status:   Future    Number of Occurrences:   1    Expiration Date:   01/16/2025   Ambulatory Referral for Lung Cancer Scre    Referral Priority:   Routine    Referral Type:   Consultation    Referral Reason:   Specialty Services Required    Number of Visits Requested:   1   Pulmonary function test    Standing Status:   Future    Expiration Date:   07/18/2025    Where should this test be performed?:   Outpatient Pulmonary    What type of PFT is being ordered?:   Full PFT   Discussion:    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 Well-managed on Trelegy with occasional albuterol  use. No significant cough or sputum production. Reports hoarseness,  possibly due to inhaler use. Smoking cessation achieved since hospitalization. Potential alpha-1 antitrypsin deficiency considered due to early onset emphysema (noted on CT scan). Lung cancer screening recommended due to smoking history. - Continue Trelegy inhaler and albuterol  as needed - Order pulmonary function tests (PFTs) to assess lung function - Referred to lung cancer screening program - Perform alpha-1 antitrypsin deficiency blood test - Advise to rinse mouth and gargle with baking soda after inhaler use to reduce hoarseness  Gastroesophageal reflux disease (GERD) GERD managed with daily omeprazole. - Continue omeprazole daily     Follow-up in 3 months time call sooner should any new problems arise.  Advised if symptoms do not improve or worsen, to please contact office for sooner follow up or seek emergency care.    I spent 60 minutes of dedicated to the care of this patient on the date of this encounter to include pre-visit review of records, face-to-face time with the patient discussing conditions above, post visit ordering of testing, clinical documentation with the electronic health record, making appropriate referrals as documented, and communicating necessary findings to members of the patients care team.   C. Leita Sanders, MD Advanced Bronchoscopy PCCM [facility]    *This note was dictated using voice recognition software/Dragon.  Despite best efforts to proofread, errors can occur which can change the meaning. Any transcriptional errors that result from this process are unintentional and may not be fully corrected at the time of dictation.

## 2024-07-20 LAB — ALPHA-1-ANTITRYPSIN PHENOTYP: A-1 Antitrypsin, Ser: 117 mg/dL (ref 101–187)

## 2024-07-22 ENCOUNTER — Ambulatory Visit: Payer: Self-pay | Admitting: Pulmonary Disease

## 2024-07-24 ENCOUNTER — Telehealth: Payer: Self-pay

## 2024-07-24 NOTE — Telephone Encounter (Signed)
 Left VM and MyChart to let pt know to call back and reschedule appt from 08/12/24.   E2C2, please reschedule pt if they call back -kh

## 2024-07-29 ENCOUNTER — Ambulatory Visit
Admission: RE | Admit: 2024-07-29 | Discharge: 2024-07-29 | Disposition: A | Discharge status: Home / Self Care | Attending: Gastroenterology | Admitting: Gastroenterology

## 2024-07-29 ENCOUNTER — Ambulatory Visit: Admitting: Anesthesiology

## 2024-07-29 ENCOUNTER — Encounter: Payer: Self-pay | Admitting: Gastroenterology

## 2024-07-29 ENCOUNTER — Encounter
Admission: RE | Disposition: A | Discharge status: Home / Self Care | Payer: Self-pay | Source: Home / Self Care | Attending: Gastroenterology

## 2024-07-29 DIAGNOSIS — J449 Chronic obstructive pulmonary disease, unspecified: Secondary | ICD-10-CM | POA: Insufficient documentation

## 2024-07-29 DIAGNOSIS — R933 Abnormal findings on diagnostic imaging of other parts of digestive tract: Secondary | ICD-10-CM | POA: Insufficient documentation

## 2024-07-29 DIAGNOSIS — Z87891 Personal history of nicotine dependence: Secondary | ICD-10-CM | POA: Insufficient documentation

## 2024-07-29 DIAGNOSIS — K219 Gastro-esophageal reflux disease without esophagitis: Secondary | ICD-10-CM | POA: Diagnosis present

## 2024-07-29 DIAGNOSIS — Z59869 Financial insecurity, unspecified: Secondary | ICD-10-CM | POA: Diagnosis not present

## 2024-07-29 DIAGNOSIS — K2100 Gastro-esophageal reflux disease with esophagitis, without bleeding: Secondary | ICD-10-CM

## 2024-07-29 HISTORY — PX: ESOPHAGOGASTRODUODENOSCOPY: SHX5428

## 2024-07-29 SURGERY — EGD (ESOPHAGOGASTRODUODENOSCOPY)
Anesthesia: General

## 2024-07-29 MED ORDER — DEXMEDETOMIDINE HCL IN NACL 80 MCG/20ML IV SOLN
INTRAVENOUS | Status: AC
  Filled 2024-07-29: qty 20

## 2024-07-29 MED ORDER — SODIUM CHLORIDE 0.9 % IV SOLN: INTRAVENOUS | Status: DC

## 2024-07-29 MED ORDER — GLYCOPYRROLATE 0.2 MG/ML IJ SOLN
INTRAMUSCULAR | Status: DC | PRN
  Administered 2024-07-29: .2 mg via INTRAVENOUS

## 2024-07-29 MED ORDER — LIDOCAINE HCL (PF) 2 % IJ SOLN
INTRAMUSCULAR | Status: AC
  Filled 2024-07-29: qty 5

## 2024-07-29 MED ORDER — PROPOFOL 500 MG/50ML IV EMUL
INTRAVENOUS | Status: DC | PRN
  Administered 2024-07-29: 75 ug/kg/min via INTRAVENOUS

## 2024-07-29 MED ORDER — LIDOCAINE HCL (CARDIAC) PF 100 MG/5ML IV SOSY
PREFILLED_SYRINGE | INTRAVENOUS | Status: DC | PRN
  Administered 2024-07-29: 40 mg via INTRAVENOUS

## 2024-07-29 MED ORDER — LIDOCAINE HCL (PF) 1 % IJ SOLN
INTRAMUSCULAR | Status: AC
  Filled 2024-07-29: qty 2

## 2024-07-29 MED ORDER — GLYCOPYRROLATE 0.2 MG/ML IJ SOLN
INTRAMUSCULAR | Status: AC
  Filled 2024-07-29: qty 1

## 2024-07-29 MED ORDER — PROPOFOL 10 MG/ML IV BOLUS
INTRAVENOUS | Status: DC | PRN
  Administered 2024-07-29: 30 mg via INTRAVENOUS
  Administered 2024-07-29: 50 mg via INTRAVENOUS

## 2024-07-29 MED ORDER — DEXMEDETOMIDINE HCL IN NACL 80 MCG/20ML IV SOLN
INTRAVENOUS | Status: DC | PRN
  Administered 2024-07-29: 12 ug via INTRAVENOUS
  Administered 2024-07-29: 8 ug via INTRAVENOUS

## 2024-07-29 NOTE — Transfer of Care (Signed)
 Immediate Anesthesia Transfer of Care Note  Patient: Christina Reyes  Procedure(s) Performed: EGD (ESOPHAGOGASTRODUODENOSCOPY)  Patient Location: PACU  Anesthesia Type:General  Level of Consciousness: sedated  Airway & Oxygen Therapy: Patient Spontanous Breathing  Post-op Assessment: Report given to RN and Post -op Vital signs reviewed and stable  Post vital signs: Reviewed and stable  Last Vitals:  Vitals Value Taken Time  BP 80/53 07/29/24 09:40  Temp 36.4 C 07/29/24 09:38  Pulse 92 07/29/24 09:41  Resp 18 07/29/24 09:41  SpO2 99 % 07/29/24 09:41  Vitals shown include unfiled device data.  Last Pain:  Vitals:   07/29/24 0938  TempSrc: Tympanic  PainSc: Asleep         Complications: No notable events documented.

## 2024-07-29 NOTE — H&P (Signed)
 Christina Copping, MD [facility] [address], [address] [place], [place] [address] [phone] Fax : [phone]  Primary Care Physician:  Abbey Bruckner, MD Primary Gastroenterologist:  Dr. Copping  Pre-Procedure History & Physical: HPI:  Christina Reyes is a 58 y.o. female is here for an endoscopy.   Past Medical History:  Diagnosis Date   Abnormal Pap smear of cervix 09/10/1993   LGSIL   ADD (attention deficit disorder)    Allergy    Seasonal   Cholecystitis 03/18/2019   COPD (chronic obstructive pulmonary disease) (HCC) [id number]   Depression    Ear pain    GERD (gastroesophageal reflux disease)    Head ache 10/26/2012   History of mammogram 02/14/2015; 04/01/2016   BIRAD 1; NEG   History of Papanicolaou smear of cervix 10/26/2012; 03/17/2016   -/-; -/-   Oral herpes 12/12/2013   Seasonal allergies    Syncope and collapse 10/26/2012    Past Surgical History:  Procedure Laterality Date   BREAST BIOPSY Left    CORE - NEG   CHOLECYSTECTOMY N/A 03/19/2019   Procedure: LAPAROSCOPIC CHOLECYSTECTOMY;  Surgeon: Jordis Laneta FALCON, MD;  Location: [facility] ORS;  Service: General;  Laterality: N/A;   COLONOSCOPY WITH PROPOFOL  N/A 10/05/2023   Procedure: COLONOSCOPY WITH PROPOFOL ;  Surgeon: Unk Corinn Skiff, MD;  Location: [facility] ENDOSCOPY;  Service: Gastroenterology;  Laterality: N/A;   TONSILLECTOMY AND ADENOIDECTOMY      Prior to Admission medications   Medication Sig Start Date End Date Taking? Authorizing Provider  Fluticasone -Umeclidin-Vilant (TRELEGY ELLIPTA ) 200-62.5-25 MCG/ACT AEPB Inhale 1 puff into the lungs daily in the afternoon.   Yes [provider]  Albuterol  Sulfate (PROAIR  RESPICLICK) 108 (90 Base) MCG/ACT AEPB Inhale 2 puffs into the lungs every 6 (six) hours as needed.    [provider]  amphetamine -dextroamphetamine  (ADDERALL) 10 MG tablet Take 10 mg by mouth. Patient taking differently: Take 5 mg by mouth.    [provider]  Cholecalciferol (D3  2000) 50 MCG (2000 UT) CAPS  03/22/24   [provider]  fluticasone  (FLONASE ) 50 MCG/ACT nasal spray Place 2 sprays into both nostrils daily. 06/11/24   Bair, Kalpana, MD  ipratropium-albuterol  (DUONEB) 0.5-2.5 (3) MG/3ML SOLN Take 3 mLs by nebulization every 6 (six) hours as needed.    [provider]  Multiple Vitamins-Minerals (CENTRUM SILVER 50+WOMEN) TABS  03/25/24   [provider]  omeprazole (PRILOSEC) 20 MG capsule Take 20 mg by mouth daily.    [provider]  thiamine  (VITAMIN B1) 100 MG tablet Take 1 tablet (100 mg total) by mouth daily. 06/18/24   Bair, Kalpana, MD  valACYclovir  (VALTREX ) 1000 MG tablet Take 1 tablet (1,000 mg total) by mouth 3 (three) times daily. 06/26/23   Corlis Burnard DEL, NP    Allergies as of 06/17/2024 - Review Complete 06/17/2024  Allergen Reaction Noted   Sulfa antibiotics Other (See Comments) 02/04/2015    Family History  Problem Relation Age of Onset   Diabetes Mother        TYPE 2   Heart disease Mother    Kidney disease Father    Heart failure Father    Breast cancer Neg Hx     Social History   Socioeconomic History   Marital status: Divorced    Spouse name: Not on file   Number of children: 2   Years of education: Not on file   Highest education level: Associate degree: occupational, scientist, product/process development, or vocational program  Occupational History  Not on file  Tobacco Use   Smoking status: Former    Current packs/day: 0.00    Average packs/day: 0.5 packs/day for 41.5 years (20.7 ttl pk-yrs)    Types: Cigarettes    Start date: 09/1982    Quit date: 03/22/2024    Years since quitting: 0.3   Smokeless tobacco: Never  Vaping Use   Vaping status: Never Used  Substance and Sexual Activity   Alcohol use: Not Currently   Drug use: No   Sexual activity: Not Currently    Birth control/protection: Post-menopausal  Other Topics Concern   Not on file  Social History Narrative   Not on file   Social Drivers of  Health   Financial Resource Strain: Medium Risk (06/10/2024)   Overall Financial Resource Strain (CARDIA)    Difficulty of Paying Living Expenses: Somewhat hard  Food Insecurity: No Food Insecurity (06/10/2024)   Hunger Vital Sign    Worried About Running Out of Food in the Last Year: Never true    Ran Out of Food in the Last Year: Never true  Transportation Needs: No Transportation Needs (06/10/2024)   PRAPARE - Administrator, Civil Service (Medical): No    Lack of Transportation (Non-Medical): No  Physical Activity: Insufficiently Active (06/10/2024)   Exercise Vital Sign    Days of Exercise per Week: 4 days    Minutes of Exercise per Session: 20 min  Stress: Stress Concern Present (06/10/2024)   [employer] of Occupational Health - Occupational Stress Questionnaire    Feeling of Stress: To some extent  Social Connections: Moderately Isolated (06/10/2024)   Social Connection and Isolation Panel    Frequency of Communication with Friends and Family: More than three times a week    Frequency of Social Gatherings with Friends and Family: Three times a week    Attends Religious Services: 1 to 4 times per year    Active Member of Clubs or Organizations: No    Attends Engineer, Structural: Not on file    Marital Status: Divorced  Catering Manager Violence: Not on file    Review of Systems: See HPI, otherwise negative ROS  Physical Exam: BP 134/82   Pulse 82   Temp (!) 97.5 F (36.4 C) (Temporal)   Resp 20   Ht 5' (1.524 m)   Wt 44.5 kg   LMP 11/25/2015 (Approximate)   SpO2 100%   BMI 19.14 kg/m  General:   Alert,  pleasant and cooperative in NAD Head:  Normocephalic and atraumatic. Neck:  Supple; no masses or thyromegaly. Lungs:  Clear throughout to auscultation.    Heart:  Regular rate and rhythm. Abdomen:  Soft, nontender and nondistended. Normal bowel sounds, without guarding, and without rebound.   Neurologic:  Alert and  oriented x4;  grossly  normal neurologically.  Impression/Plan: Christina Reyes is here for an endoscopy to be performed for GERD and abnormal CT scan  Risks, benefits, limitations, and alternatives regarding  endoscopy have been reviewed with the patient.  Questions have been answered.  All parties agreeable.   Christina Copping, MD  07/29/2024, 9:21 AM

## 2024-07-29 NOTE — Anesthesia Preprocedure Evaluation (Signed)
 Anesthesia Evaluation  Patient identified by MRN, date of birth, ID band Patient awake    Reviewed: Allergy & Precautions, NPO status , Patient's Chart, lab work & pertinent test results  History of Anesthesia Complications Negative for: history of anesthetic complications  Airway Mallampati: I  TM Distance: >3 FB Neck ROM: full    Dental no notable dental hx.    Pulmonary COPD,  COPD inhaler, former smoker   Pulmonary exam normal        Cardiovascular negative cardio ROS Normal cardiovascular exam     Neuro/Psych  Headaches PSYCHIATRIC DISORDERS Anxiety Depression       GI/Hepatic Neg liver ROS,GERD  Medicated,,  Endo/Other  negative endocrine ROS    Renal/GU negative Renal ROS  negative genitourinary   Musculoskeletal   Abdominal   Peds  Hematology negative hematology ROS (+)   Anesthesia Other Findings Past Medical History: 09/10/1993: Abnormal Pap smear of cervix     Comment:  LGSIL No date: ADD (attention deficit disorder) No date: Allergy     Comment:  Seasonal 03/18/2019: Cholecystitis [id number]: COPD (chronic obstructive pulmonary disease) (HCC) No date: Depression No date: Ear pain No date: GERD (gastroesophageal reflux disease) 10/26/2012: Head ache 02/14/2015; 04/01/2016: History of mammogram     Comment:  BIRAD 1; NEG 10/26/2012; 03/17/2016: History of Papanicolaou smear of cervix     Comment:  -/-; -/- 12/12/2013: Oral herpes No date: Seasonal allergies 10/26/2012: Syncope and collapse  Past Surgical History: No date: BREAST BIOPSY; Left     Comment:  CORE - NEG 03/19/2019: CHOLECYSTECTOMY; N/A     Comment:  Procedure: LAPAROSCOPIC CHOLECYSTECTOMY;  Surgeon:               Jordis Laneta FALCON, MD;  Location: [facility] ORS;  Service:               General;  Laterality: N/A; 10/05/2023: COLONOSCOPY WITH PROPOFOL ; N/A     Comment:  Procedure: COLONOSCOPY WITH PROPOFOL ;  Surgeon: Unk Corinn Skiff, MD;  Location: [facility] ENDOSCOPY;  Service:               Gastroenterology;  Laterality: N/A; No date: TONSILLECTOMY AND ADENOIDECTOMY  BMI    Body Mass Index: 19.14 kg/m      Reproductive/Obstetrics negative OB ROS                              Anesthesia Physical Anesthesia Plan  ASA: 2  Anesthesia Plan: General   Post-op Pain Management: Minimal or no pain anticipated   Induction: Intravenous  PONV Risk Score and Plan: 1 and Propofol  infusion and TIVA  Airway Management Planned: Natural Airway and Nasal Cannula  Additional Equipment:   Intra-op Plan:   Post-operative Plan:   Informed Consent: I have reviewed the patients History and Physical, chart, labs and discussed the procedure including the risks, benefits and alternatives for the proposed anesthesia with the patient or authorized representative who has indicated his/her understanding and acceptance.     Dental Advisory Given  Plan Discussed with: Anesthesiologist, CRNA and Surgeon  Anesthesia Plan Comments: (Patient consented for risks of anesthesia including but not limited to:  - adverse reactions to medications - risk of airway placement if required - damage to eyes, teeth, lips or other oral mucosa - nerve damage due to positioning  - sore throat or hoarseness -  Damage to heart, brain, nerves, lungs, other parts of body or loss of life  Patient voiced understanding and assent.)        Anesthesia Quick Evaluation

## 2024-07-29 NOTE — Op Note (Signed)
 [facility] Gastroenterology Patient Name: Christina Reyes Procedure Date: 07/29/2024 9:19 AM MRN: [id number] Account #: [id number] Date of Birth: [birth year] Admit Type: Outpatient Age: 58 Room: [facility] ENDO ROOM 4 Gender: Female Note Status: Finalized Instrument Name: Upper GI Scope [id number] Procedure:             Upper GI endoscopy Indications:           Heartburn, Abnormal CT of the GI tract Providers:             Rogelia Copping MD, MD Referring MD:          Loa MICAEL Lamp (Referring MD) Medicines:             Propofol  per Anesthesia Complications:         No immediate complications. Procedure:             Pre-Anesthesia Assessment:                        - Prior to the procedure, a History and Physical was                         performed, and patient medications and allergies were                         reviewed. The patient's tolerance of previous                         anesthesia was also reviewed. The risks and benefits                         of the procedure and the sedation options and risks                         were discussed with the patient. All questions were                         answered, and informed consent was obtained. Prior                         Anticoagulants: The patient has taken no anticoagulant                         or antiplatelet agents. ASA Grade Assessment: II - A                         patient with mild systemic disease. After reviewing                         the risks and benefits, the patient was deemed in                         satisfactory condition to undergo the procedure.                        After obtaining informed consent, the endoscope was                         passed under direct vision. Throughout the procedure,  the patient's blood pressure, pulse, and oxygen                         saturations were monitored continuously. The Endoscope                         was introduced through the  mouth, and advanced to the                         second part of duodenum. The upper GI endoscopy was                         accomplished without difficulty. The patient tolerated                         the procedure well. Findings:      The examined esophagus was normal.      The stomach was normal.      The examined duodenum was normal. Impression:            - Normal esophagus.                        - Normal stomach.                        - Normal examined duodenum.                        - No specimens collected. Recommendation:        - Discharge patient to home.                        - Resume previous diet.                        - Continue present medications. Procedure Code(s):     --- Professional ---                        [phone], Esophagogastroduodenoscopy, flexible,                         transoral; diagnostic, including collection of                         specimen(s) by brushing or washing, when performed                         (separate procedure) Diagnosis Code(s):     --- Professional ---                        R93.3, Abnormal findings on diagnostic imaging of                         other parts of digestive tract                        R12, Heartburn CPT copyright 2022 American Medical Association. All rights reserved. The codes documented in this report are preliminary and upon coder review may  be revised to meet current compliance requirements. Rogelia Copping MD,  MD 07/29/2024 9:35:32 AM This report has been signed electronically. Number of Addenda: 0 Note Initiated On: 07/29/2024 9:19 AM Estimated Blood Loss:  Estimated blood loss: none.      [facility]

## 2024-07-29 NOTE — Anesthesia Postprocedure Evaluation (Signed)
 Anesthesia Post Note  Patient: Christina Reyes  Procedure(s) Performed: EGD (ESOPHAGOGASTRODUODENOSCOPY)  Patient location during evaluation: Endoscopy Anesthesia Type: General Level of consciousness: awake and alert Pain management: pain level controlled Vital Signs Assessment: post-procedure vital signs reviewed and stable Respiratory status: spontaneous breathing, nonlabored ventilation, respiratory function stable and patient connected to nasal cannula oxygen Cardiovascular status: blood pressure returned to baseline and stable Postop Assessment: no apparent nausea or vomiting Anesthetic complications: no   No notable events documented.   Last Vitals:  Vitals:   07/29/24 0938 07/29/24 0948  BP: (!) 71/48 (!) 82/53  Pulse: 91 91  Resp: 19 19  Temp: (!) 36.4 C   SpO2: 96% 96%    Last Pain:  Vitals:   07/29/24 0948  TempSrc:   PainSc: 0-No pain                 [name]

## 2024-07-30 ENCOUNTER — Telehealth: Payer: Self-pay

## 2024-07-30 NOTE — Telephone Encounter (Signed)
 Copied from CRM [id number]. Topic: Appointments - Scheduling Inquiry for Clinic >> Jul 30, 2024  1:48 PM Aleatha C wrote: Reason for CRM: Patient called in to rescheduled appointment that was cancel from provider and the next availability isnt until the February she wanted to know is okay to reschedule for that far out or does she needs to be seen before please call to further dicuss  I spoke with patient and let her know that 08/07/2024 at 8am just opened on Dr. Kalpana Bair's schedule.  I asked patient if this date/time would work with her schedule and she states it does, so I rescheduled her appointment.

## 2024-08-07 ENCOUNTER — Ambulatory Visit (INDEPENDENT_AMBULATORY_CARE_PROVIDER_SITE_OTHER)

## 2024-08-07 VITALS — BP 110/70 | HR 83 | Temp 97.8°F | Ht 60.0 in | Wt 99.2 lb

## 2024-08-07 DIAGNOSIS — B001 Herpesviral vesicular dermatitis: Secondary | ICD-10-CM

## 2024-08-07 DIAGNOSIS — J432 Centrilobular emphysema: Secondary | ICD-10-CM | POA: Diagnosis not present

## 2024-08-07 DIAGNOSIS — K219 Gastro-esophageal reflux disease without esophagitis: Secondary | ICD-10-CM | POA: Diagnosis not present

## 2024-08-07 DIAGNOSIS — R41840 Attention and concentration deficit: Secondary | ICD-10-CM | POA: Diagnosis not present

## 2024-08-07 DIAGNOSIS — R7303 Prediabetes: Secondary | ICD-10-CM | POA: Diagnosis not present

## 2024-08-07 MED ORDER — AMPHETAMINE-DEXTROAMPHETAMINE 5 MG PO TABS: 5.0000 mg | ORAL_TABLET | Freq: Two times a day (BID) | ORAL | 0 refills | Status: AC

## 2024-08-07 MED ORDER — OMEPRAZOLE 20 MG PO CPDR: 20.0000 mg | DELAYED_RELEASE_CAPSULE | Freq: Every day | ORAL | 3 refills | Status: AC

## 2024-08-07 NOTE — Assessment & Plan Note (Signed)
 A1c at 6.1% indicates prediabetes. No immediate concern for diabetes progression with current lifestyle.Encouraged dietary modifications to reduce carbohydrate intake and increase whole foods. Monitor A1c levels in future visits.

## 2024-08-07 NOTE — Assessment & Plan Note (Signed)
 ADHD symptoms well-managed with current medication regimen. PDMP reviewed.  Prescribed Adderall 5 mg twice daily as needed was sent to the pharmacy.  Orders:   amphetamine -dextroamphetamine  (ADDERALL) 5 MG tablet; Take 1 tablet (5 mg total) by mouth 2 (two) times daily. After breakfast and lunch   amphetamine -dextroamphetamine  (ADDERALL) 5 MG tablet; Take 1 tablet (5 mg total) by mouth 2 (two) times daily. After breakfast and lunch   amphetamine -dextroamphetamine  (ADDERALL) 5 MG tablet; Take 1 tablet (5 mg total) by mouth 2 (two) times daily. After breakfast and lunch

## 2024-08-07 NOTE — Progress Notes (Signed)
 Established Patient Office Visit   Subjective  Patient ID: Christina Reyes, female    DOB: 04-01-1966  Age: 58 y.o. MRN: [id number]  Chief Complaint  Patient presents with   ADHD    Discussed the use of AI scribe software for clinical note transcription with the patient, who gave verbal consent to proceed.  History of Present Illness Patient presents for a follow up visit from 06/11/24. Since her last visit:  - Patient has seen GI and underwent EGD on 07/29/24 to further evaluate into abnormal CT scan done at [facility] on and GERD. Normal EGD. She takes Omeprazole 20 mg every morning, over the counter.  She is expected to follow up with GI in about 3 months. Has noted improved appetite, feels better overall compared to her last office visit. Has slowly gained weight compared to last OV was 98 lb, she is 99.2 lb today. Eats 3 meals a day.   - H/O smoking, 21 pack year history of smoking: - COPD Quit smoking since hospitalization on 02/2024.   Currently on Trelegy daily and prn albuterol  with symptoms well controlled.  Has seen pulmonologist Dr. Tamea and has PFT, follow up visit with her  scheduled. She was also referred to low dose CT lungs for lung cancer screening as well.  Was found to be a carrier for alpha one antitrypsin based on evaluation with pulmonology.  No vomiting, stomach pain, and she feels more relaxed with personal life improvements. She has not needed daily use of albuterol  and reports no new symptoms since her last visit.  - Breast cancer screening: She is scheduled for vs mammogram on 08/16/2024.   - She is UDT on cervical cancer screening. Co-testing was normal on 12/30/2020 with ob/gyn.  - She declines updating pneumonia, covid-19, tetanus, shingles immunization.   - ADHD: She was on Adderall 10 mg daily, her medication was adjusted to 5 mg twice a day as needed. She  Adderall 5 mg, BID (take one tablet in the morning before work and 1 tablet around noon) during work  days. She is taking Adderall dose to 5 mg, taking it in the morning and sometimes at lunch, depending on her needs. She feels this adjustment has helped her eat more and feel less 'wound up'.  - Gets occasional cold sores on lips for which she takes Valtrex  1000 mg daily prn for flare ups.    -  Labs from 06/11/24:  LDL: 145, continues to exercise, eats healthy.  Prediabetes: A1c 6.1% TSH/T4 normal Vitamin D  normal  CBC normal     ROS As per HPI    Objective:     BP 110/70   Pulse 83   Temp 97.8 F (36.6 C) (Oral)   Ht 5' (1.524 m)   Wt 99 lb 3.2 oz (45 kg)   LMP 11/25/2015 (Approximate)   SpO2 97%   BMI 19.37 kg/m      08/07/2024    8:17 AM 06/11/2024    9:05 AM  Depression screen PHQ 2/9  Decreased Interest 0 0  Down, Depressed, Hopeless 0 0  PHQ - 2 Score 0 0  Altered sleeping 0 1  Tired, decreased energy 0 1  Change in appetite 0 0  Feeling bad or failure about yourself  0 0  Trouble concentrating 0 0  Moving slowly or fidgety/restless 0 0  Suicidal thoughts 0 0  PHQ-9 Score 0 2   Difficult doing work/chores Not difficult at all Somewhat difficult  Data saved with a previous flowsheet row definition      08/07/2024    8:16 AM 06/11/2024    9:05 AM  GAD 7 : Generalized Anxiety Score  Nervous, Anxious, on Edge 0 1  Control/stop worrying 0 0  Worry too much - different things 0 0  Trouble relaxing 0 0  Restless 0 0  Easily annoyed or irritable 0 1  Afraid - awful might happen 0 0  Total GAD 7 Score 0 2  Anxiety Difficulty Not difficult at all Not difficult at all      08/07/2024    8:17 AM 06/11/2024    9:05 AM  Depression screen PHQ 2/9  Decreased Interest 0 0  Down, Depressed, Hopeless 0 0  PHQ - 2 Score 0 0  Altered sleeping 0 1  Tired, decreased energy 0 1  Change in appetite 0 0  Feeling bad or failure about yourself  0 0  Trouble concentrating 0 0  Moving slowly or fidgety/restless 0 0  Suicidal thoughts 0 0  PHQ-9 Score 0 2    Difficult doing work/chores Not difficult at all Somewhat difficult     Data saved with a previous flowsheet row definition      08/07/2024    8:16 AM 06/11/2024    9:05 AM  GAD 7 : Generalized Anxiety Score  Nervous, Anxious, on Edge 0 1  Control/stop worrying 0 0  Worry too much - different things 0 0  Trouble relaxing 0 0  Restless 0 0  Easily annoyed or irritable 0 1  Afraid - awful might happen 0 0  Total GAD 7 Score 0 2  Anxiety Difficulty Not difficult at all Not difficult at all   SDOH Screenings   Food Insecurity: No Food Insecurity (08/06/2024)  Housing: Unknown (08/06/2024)  Transportation Needs: No Transportation Needs (08/06/2024)  Depression (PHQ2-9): Low Risk  (08/07/2024)  Financial Resource Strain: Medium Risk (08/06/2024)  Physical Activity: Insufficiently Active (08/06/2024)  Social Connections: Moderately Isolated (08/06/2024)  Stress: Stress Concern Present (08/06/2024)  Tobacco Use: Medium Risk (08/07/2024)     Physical Exam Constitutional:      General: She is not in acute distress.    Appearance: Normal appearance.  HENT:     Head: Normocephalic and atraumatic.     Right Ear: Tympanic membrane normal.     Left Ear: Tympanic membrane normal.     Mouth/Throat:     Mouth: Mucous membranes are moist.  Neck:     Thyroid: No thyroid mass or thyroid tenderness.  Cardiovascular:     Rate and Rhythm: Normal rate and regular rhythm.  Pulmonary:     Effort: Pulmonary effort is normal.     Breath sounds: Normal breath sounds.  Abdominal:     General: Bowel sounds are normal.     Palpations: Abdomen is soft.     Tenderness: There is no abdominal tenderness. There is no guarding.  Musculoskeletal:     Cervical back: Neck supple. No rigidity or tenderness.     Right lower leg: No edema.     Left lower leg: No edema.  Skin:    General: Skin is warm.  Neurological:     Mental Status: She is alert and oriented to person, place, and time.     Motor:  No weakness.  Psychiatric:        Mood and Affect: Mood normal.        Behavior: Behavior normal.  No results found for any visits on 08/07/24.  The 10-year ASCVD risk score (Arnett DK, et al., 2019) is: 2%     Assessment & Plan:   Assessment & Plan Gastroesophageal reflux disease, unspecified whether esophagitis present Symptoms stable on Omeprazole 20 mg daily, continue. Sent prescription for omeprazole if insurance covers it. Weight improved compared to last visit.  Recommend repeat thiamine  level during follow up visit. Continue taking daily multivitamin.  Orders:   omeprazole (PRILOSEC) 20 MG capsule; Take 1 capsule (20 mg total) by mouth daily.  Attention or concentration deficit ADHD symptoms well-managed with current medication regimen. PDMP reviewed.  Prescribed Adderall 5 mg twice daily as needed was sent to the pharmacy.  Orders:   amphetamine -dextroamphetamine  (ADDERALL) 5 MG tablet; Take 1 tablet (5 mg total) by mouth 2 (two) times daily. After breakfast and lunch   amphetamine -dextroamphetamine  (ADDERALL) 5 MG tablet; Take 1 tablet (5 mg total) by mouth 2 (two) times daily. After breakfast and lunch   amphetamine -dextroamphetamine  (ADDERALL) 5 MG tablet; Take 1 tablet (5 mg total) by mouth 2 (two) times daily. After breakfast and lunch  Prediabetes A1c at 6.1% indicates prediabetes. No immediate concern for diabetes progression with current lifestyle.Encouraged dietary modifications to reduce carbohydrate intake and increase whole foods. Monitor A1c levels in future visits.    Centrilobular emphysema (HCC) Smoking cessation achieved since 02/2024. Continue Trelegy once daily. Use albuterol  as needed. Maintain smoking cessation. Follow up with pulmonary as scheduled. Discussed shingles, tetanus, pneumonia and COVID-19 vaccines. Patient kindly declined.     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 Recommend patient reach out to us  when she gets  closer to finishing current prescription.       Return in about 3 months (around 11/07/2024) for Follow up in 3 months, virtual is okay for ADHD .   Luke Shade, MD

## 2024-08-07 NOTE — Patient Instructions (Signed)
-   Follow up in 3 months, virtual is okay.

## 2024-08-07 NOTE — Assessment & Plan Note (Signed)
 Symptoms stable on Omeprazole 20 mg daily, continue. Sent prescription for omeprazole if insurance covers it. Weight improved compared to last visit.  Recommend repeat thiamine  level during follow up visit. Continue taking daily multivitamin.  Orders:   omeprazole (PRILOSEC) 20 MG capsule; Take 1 capsule (20 mg total) by mouth daily.

## 2024-08-07 NOTE — Assessment & Plan Note (Signed)
 Smoking cessation achieved since 02/2024. Continue Trelegy once daily. Use albuterol  as needed. Maintain smoking cessation. Follow up with pulmonary as scheduled. Discussed shingles, tetanus, pneumonia and COVID-19 vaccines. Patient kindly declined.

## 2024-08-07 NOTE — Assessment & Plan Note (Signed)
 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 Recommend patient reach out to us  when she gets closer to finishing current prescription.

## 2024-08-12 ENCOUNTER — Ambulatory Visit

## 2024-08-16 ENCOUNTER — Ambulatory Visit
Admission: RE | Admit: 2024-08-16 | Discharge: 2024-08-16 | Disposition: A | Discharge status: Home / Self Care | Source: Ambulatory Visit

## 2024-08-16 DIAGNOSIS — R928 Other abnormal and inconclusive findings on diagnostic imaging of breast: Secondary | ICD-10-CM | POA: Insufficient documentation

## 2024-09-12 ENCOUNTER — Other Ambulatory Visit: Payer: Self-pay

## 2024-09-12 DIAGNOSIS — J301 Allergic rhinitis due to pollen: Secondary | ICD-10-CM

## 2024-09-12 DIAGNOSIS — J432 Centrilobular emphysema: Secondary | ICD-10-CM

## 2024-09-13 ENCOUNTER — Other Ambulatory Visit: Payer: Self-pay

## 2024-09-13 DIAGNOSIS — Z87891 Personal history of nicotine dependence: Secondary | ICD-10-CM

## 2024-09-13 DIAGNOSIS — J439 Emphysema, unspecified: Secondary | ICD-10-CM

## 2024-09-13 MED ORDER — TRELEGY ELLIPTA 200-62.5-25 MCG/ACT IN AEPB: 1.0000 | INHALATION_SPRAY | Freq: Every day | RESPIRATORY_TRACT | 1 refills | Status: AC

## 2024-09-13 NOTE — Telephone Encounter (Signed)
 Flonase  refill sent. Please forward Trelegy refill request to Dr. Tamea (pulmonology).  1. Seasonal allergic rhinitis due to pollen - fluticasone  (FLONASE ) 50 MCG/ACT nasal spray; USE 2 SPRAYS INTO BOTH NOSTRILS DAILY  Dispense: 16 g; Refill: 0  2. Centrilobular emphysema (HCC)      Luke Shade, MD

## 2024-09-13 NOTE — Telephone Encounter (Signed)
 Trellegy refill has been submitted to pharmacy on file. NFN.

## 2024-10-18 ENCOUNTER — Ambulatory Visit: Admitting: Pulmonary Disease

## 2024-10-18 ENCOUNTER — Encounter

## 2024-11-01 ENCOUNTER — Other Ambulatory Visit: Payer: Self-pay

## 2024-11-01 DIAGNOSIS — J432 Centrilobular emphysema: Secondary | ICD-10-CM

## 2024-11-07 ENCOUNTER — Ambulatory Visit

## 2024-12-17 ENCOUNTER — Ambulatory Visit: Admitting: Pulmonary Disease

## 2024-12-17 ENCOUNTER — Encounter
